# Patient Record
Sex: Female | Born: 1965 | Race: White | Hispanic: No | Marital: Married | Smoking: Current every day smoker
Health system: Southern US, Community
[De-identification: ages and names within clinical notes are randomized; demographics above are authoritative.]

## PROBLEM LIST (undated history)

## (undated) DIAGNOSIS — G5600 Carpal tunnel syndrome, unspecified upper limb: Secondary | ICD-10-CM

## (undated) DIAGNOSIS — E669 Obesity, unspecified: Secondary | ICD-10-CM

## (undated) DIAGNOSIS — K219 Gastro-esophageal reflux disease without esophagitis: Secondary | ICD-10-CM

## (undated) DIAGNOSIS — F329 Major depressive disorder, single episode, unspecified: Secondary | ICD-10-CM

## (undated) DIAGNOSIS — G4730 Sleep apnea, unspecified: Secondary | ICD-10-CM

## (undated) DIAGNOSIS — M25569 Pain in unspecified knee: Secondary | ICD-10-CM

## (undated) DIAGNOSIS — M5030 Other cervical disc degeneration, unspecified cervical region: Secondary | ICD-10-CM

## (undated) DIAGNOSIS — R7303 Prediabetes: Secondary | ICD-10-CM

## (undated) DIAGNOSIS — Z87410 Personal history of cervical dysplasia: Secondary | ICD-10-CM

## (undated) DIAGNOSIS — R079 Chest pain, unspecified: Secondary | ICD-10-CM

## (undated) DIAGNOSIS — R61 Generalized hyperhidrosis: Secondary | ICD-10-CM

## (undated) DIAGNOSIS — Z720 Tobacco use: Secondary | ICD-10-CM

## (undated) DIAGNOSIS — E039 Hypothyroidism, unspecified: Secondary | ICD-10-CM

## (undated) DIAGNOSIS — E119 Type 2 diabetes mellitus without complications: Secondary | ICD-10-CM

## (undated) DIAGNOSIS — K5790 Diverticulosis of intestine, part unspecified, without perforation or abscess without bleeding: Secondary | ICD-10-CM

## (undated) DIAGNOSIS — R5383 Other fatigue: Secondary | ICD-10-CM

## (undated) DIAGNOSIS — N63 Unspecified lump in unspecified breast: Secondary | ICD-10-CM

## (undated) HISTORY — DX: Other fatigue: R53.83

## (undated) HISTORY — DX: Obesity, unspecified: E66.9

## (undated) HISTORY — DX: Prediabetes: R73.03

## (undated) HISTORY — DX: Major depressive disorder, single episode, unspecified: F32.9

## (undated) HISTORY — PX: CHOLECYSTECTOMY: SHX55

## (undated) HISTORY — DX: Unspecified lump in unspecified breast: N63.0

## (undated) HISTORY — DX: Type 2 diabetes mellitus without complications: E11.9

## (undated) HISTORY — DX: Pain in unspecified knee: M25.569

## (undated) HISTORY — DX: Tobacco use: Z72.0

## (undated) HISTORY — DX: Chest pain, unspecified: R07.9

## (undated) HISTORY — PX: ABDOMINAL HYSTERECTOMY: SHX81

## (undated) HISTORY — DX: Generalized hyperhidrosis: R61

## (undated) HISTORY — PX: HEMORROIDECTOMY: SUR656

## (undated) HISTORY — PX: COLONOSCOPY: SHX174

## (undated) HISTORY — PX: ESOPHAGOGASTRODUODENOSCOPY: SHX1529

## (undated) HISTORY — DX: Personal history of cervical dysplasia: Z87.410

## (undated) HISTORY — DX: Other cervical disc degeneration, unspecified cervical region: M50.30

## (undated) HISTORY — PX: CERVICAL DISCECTOMY: SHX98

## (undated) HISTORY — PX: EXPLORATORY LAPAROTOMY: SUR591

## (undated) HISTORY — PX: TONSILLECTOMY: SUR1361

---

## 1999-06-16 ENCOUNTER — Emergency Department (HOSPITAL_COMMUNITY): Admission: EM | Admit: 1999-06-16 | Discharge: 1999-06-17 | Payer: Self-pay | Admitting: *Deleted

## 1999-06-17 ENCOUNTER — Emergency Department (HOSPITAL_COMMUNITY): Admission: EM | Admit: 1999-06-17 | Discharge: 1999-06-17 | Payer: Self-pay | Admitting: Emergency Medicine

## 1999-06-18 ENCOUNTER — Encounter: Payer: Self-pay | Admitting: Emergency Medicine

## 1999-07-10 ENCOUNTER — Ambulatory Visit (HOSPITAL_COMMUNITY): Admission: RE | Admit: 1999-07-10 | Discharge: 1999-07-10 | Payer: Self-pay | Admitting: Neurosurgery

## 1999-07-10 ENCOUNTER — Encounter: Payer: Self-pay | Admitting: Neurosurgery

## 1999-07-27 ENCOUNTER — Emergency Department (HOSPITAL_COMMUNITY): Admission: EM | Admit: 1999-07-27 | Discharge: 1999-07-27 | Payer: Self-pay | Admitting: *Deleted

## 2000-06-29 ENCOUNTER — Emergency Department (HOSPITAL_COMMUNITY): Admission: EM | Admit: 2000-06-29 | Discharge: 2000-06-29 | Payer: Self-pay | Admitting: Emergency Medicine

## 2000-06-29 ENCOUNTER — Encounter: Payer: Self-pay | Admitting: Emergency Medicine

## 2001-01-06 ENCOUNTER — Encounter: Payer: Self-pay | Admitting: Emergency Medicine

## 2001-01-06 ENCOUNTER — Emergency Department (HOSPITAL_COMMUNITY): Admission: EM | Admit: 2001-01-06 | Discharge: 2001-01-06 | Payer: Self-pay | Admitting: Emergency Medicine

## 2001-01-16 ENCOUNTER — Ambulatory Visit (HOSPITAL_COMMUNITY): Admission: RE | Admit: 2001-01-16 | Discharge: 2001-01-16 | Payer: Self-pay | Admitting: Family Medicine

## 2001-01-16 ENCOUNTER — Encounter: Payer: Self-pay | Admitting: Family Medicine

## 2001-03-15 ENCOUNTER — Ambulatory Visit (HOSPITAL_COMMUNITY): Admission: RE | Admit: 2001-03-15 | Discharge: 2001-03-15 | Payer: Self-pay | Admitting: *Deleted

## 2001-03-15 ENCOUNTER — Encounter (INDEPENDENT_AMBULATORY_CARE_PROVIDER_SITE_OTHER): Payer: Self-pay

## 2001-05-04 ENCOUNTER — Emergency Department (HOSPITAL_COMMUNITY): Admission: EM | Admit: 2001-05-04 | Discharge: 2001-05-05 | Payer: Self-pay

## 2002-01-30 ENCOUNTER — Encounter: Payer: Self-pay | Admitting: Family Medicine

## 2002-01-30 ENCOUNTER — Ambulatory Visit (HOSPITAL_COMMUNITY): Admission: RE | Admit: 2002-01-30 | Discharge: 2002-01-30 | Payer: Self-pay | Admitting: Family Medicine

## 2003-05-21 ENCOUNTER — Emergency Department (HOSPITAL_COMMUNITY): Admission: AD | Admit: 2003-05-21 | Discharge: 2003-05-21 | Payer: Self-pay | Admitting: Emergency Medicine

## 2003-05-21 ENCOUNTER — Encounter: Payer: Self-pay | Admitting: Emergency Medicine

## 2003-08-14 ENCOUNTER — Other Ambulatory Visit: Admission: RE | Admit: 2003-08-14 | Discharge: 2003-08-14 | Payer: Self-pay | Admitting: Obstetrics and Gynecology

## 2004-01-11 ENCOUNTER — Emergency Department (HOSPITAL_COMMUNITY): Admission: AD | Admit: 2004-01-11 | Discharge: 2004-01-11 | Payer: Self-pay | Admitting: Family Medicine

## 2004-02-24 ENCOUNTER — Emergency Department (HOSPITAL_COMMUNITY): Admission: AD | Admit: 2004-02-24 | Discharge: 2004-02-24 | Payer: Self-pay | Admitting: Family Medicine

## 2004-06-21 ENCOUNTER — Emergency Department (HOSPITAL_COMMUNITY): Admission: EM | Admit: 2004-06-21 | Discharge: 2004-06-21 | Payer: Self-pay | Admitting: Emergency Medicine

## 2004-06-22 ENCOUNTER — Emergency Department (HOSPITAL_COMMUNITY): Admission: EM | Admit: 2004-06-22 | Discharge: 2004-06-23 | Payer: Self-pay | Admitting: Emergency Medicine

## 2004-09-09 ENCOUNTER — Ambulatory Visit (HOSPITAL_COMMUNITY): Admission: RE | Admit: 2004-09-09 | Discharge: 2004-09-09 | Payer: Self-pay | Admitting: Cardiology

## 2004-09-24 ENCOUNTER — Encounter (INDEPENDENT_AMBULATORY_CARE_PROVIDER_SITE_OTHER): Payer: Self-pay | Admitting: *Deleted

## 2004-09-24 ENCOUNTER — Ambulatory Visit (HOSPITAL_COMMUNITY): Admission: RE | Admit: 2004-09-24 | Discharge: 2004-09-24 | Payer: Self-pay | Admitting: *Deleted

## 2005-01-20 ENCOUNTER — Other Ambulatory Visit: Admission: RE | Admit: 2005-01-20 | Discharge: 2005-01-20 | Payer: Self-pay | Admitting: Obstetrics and Gynecology

## 2005-05-24 ENCOUNTER — Observation Stay (HOSPITAL_COMMUNITY): Admission: RE | Admit: 2005-05-24 | Discharge: 2005-05-25 | Payer: Self-pay | Admitting: Obstetrics and Gynecology

## 2005-05-24 ENCOUNTER — Encounter (INDEPENDENT_AMBULATORY_CARE_PROVIDER_SITE_OTHER): Payer: Self-pay | Admitting: *Deleted

## 2005-06-17 ENCOUNTER — Ambulatory Visit (HOSPITAL_COMMUNITY): Admission: RE | Admit: 2005-06-17 | Discharge: 2005-06-17 | Payer: Self-pay | Admitting: Obstetrics and Gynecology

## 2006-07-25 ENCOUNTER — Emergency Department (HOSPITAL_COMMUNITY): Admission: EM | Admit: 2006-07-25 | Discharge: 2006-07-25 | Payer: Self-pay | Admitting: Emergency Medicine

## 2006-11-08 ENCOUNTER — Emergency Department (HOSPITAL_COMMUNITY): Admission: EM | Admit: 2006-11-08 | Discharge: 2006-11-08 | Payer: Self-pay | Admitting: Family Medicine

## 2007-04-04 ENCOUNTER — Emergency Department (HOSPITAL_COMMUNITY): Admission: EM | Admit: 2007-04-04 | Discharge: 2007-04-04 | Payer: Self-pay | Admitting: Emergency Medicine

## 2007-05-01 ENCOUNTER — Emergency Department (HOSPITAL_COMMUNITY): Admission: EM | Admit: 2007-05-01 | Discharge: 2007-05-01 | Payer: Self-pay | Admitting: Emergency Medicine

## 2007-11-25 ENCOUNTER — Emergency Department (HOSPITAL_COMMUNITY): Admission: EM | Admit: 2007-11-25 | Discharge: 2007-11-25 | Payer: Self-pay | Admitting: Emergency Medicine

## 2008-11-17 ENCOUNTER — Emergency Department (HOSPITAL_COMMUNITY): Admission: EM | Admit: 2008-11-17 | Discharge: 2008-11-18 | Payer: Self-pay | Admitting: Emergency Medicine

## 2008-12-11 ENCOUNTER — Emergency Department (HOSPITAL_COMMUNITY): Admission: EM | Admit: 2008-12-11 | Discharge: 2008-12-11 | Payer: Self-pay | Admitting: Family Medicine

## 2009-03-13 ENCOUNTER — Emergency Department (HOSPITAL_COMMUNITY): Admission: EM | Admit: 2009-03-13 | Discharge: 2009-03-13 | Payer: Self-pay | Admitting: Emergency Medicine

## 2009-05-11 ENCOUNTER — Emergency Department: Payer: Self-pay | Admitting: Internal Medicine

## 2010-11-19 ENCOUNTER — Emergency Department (HOSPITAL_COMMUNITY)
Admission: EM | Admit: 2010-11-19 | Discharge: 2010-11-20 | Payer: Self-pay | Source: Home / Self Care | Admitting: Emergency Medicine

## 2010-12-05 ENCOUNTER — Encounter: Payer: Self-pay | Admitting: Orthopedic Surgery

## 2011-01-06 ENCOUNTER — Emergency Department: Payer: Self-pay | Admitting: Emergency Medicine

## 2011-01-11 ENCOUNTER — Emergency Department: Payer: Self-pay | Admitting: Emergency Medicine

## 2011-01-11 ENCOUNTER — Emergency Department (HOSPITAL_COMMUNITY)
Admission: EM | Admit: 2011-01-11 | Discharge: 2011-01-11 | Discharge status: Against Medical Advice | Payer: Self-pay | Attending: Emergency Medicine | Admitting: Emergency Medicine

## 2011-01-11 DIAGNOSIS — R509 Fever, unspecified: Secondary | ICD-10-CM | POA: Insufficient documentation

## 2011-01-11 DIAGNOSIS — R1012 Left upper quadrant pain: Secondary | ICD-10-CM | POA: Insufficient documentation

## 2011-01-15 ENCOUNTER — Emergency Department (HOSPITAL_COMMUNITY)
Admission: EM | Admit: 2011-01-15 | Discharge: 2011-01-15 | Disposition: A | Discharge status: Home / Self Care | Payer: Self-pay | Attending: Emergency Medicine | Admitting: Emergency Medicine

## 2011-01-15 ENCOUNTER — Emergency Department (HOSPITAL_COMMUNITY): Payer: Self-pay

## 2011-01-15 DIAGNOSIS — R0602 Shortness of breath: Secondary | ICD-10-CM | POA: Insufficient documentation

## 2011-01-15 DIAGNOSIS — R209 Unspecified disturbances of skin sensation: Secondary | ICD-10-CM | POA: Insufficient documentation

## 2011-01-15 DIAGNOSIS — R109 Unspecified abdominal pain: Secondary | ICD-10-CM | POA: Insufficient documentation

## 2011-01-15 DIAGNOSIS — R071 Chest pain on breathing: Secondary | ICD-10-CM | POA: Insufficient documentation

## 2011-01-15 LAB — CBC
HCT: 43.1 % (ref 36.0–46.0)
Hemoglobin: 14.4 g/dL (ref 12.0–15.0)
MCHC: 33.4 g/dL (ref 30.0–36.0)
RBC: 4.86 MIL/uL (ref 3.87–5.11)

## 2011-01-15 LAB — DIFFERENTIAL
Basophils Absolute: 0.1 10*3/uL (ref 0.0–0.1)
Basophils Relative: 1 % (ref 0–1)
Lymphocytes Relative: 46 % (ref 12–46)
Monocytes Absolute: 0.7 10*3/uL (ref 0.1–1.0)
Neutro Abs: 4.7 10*3/uL (ref 1.7–7.7)
Neutrophils Relative %: 44 % (ref 43–77)

## 2011-01-15 LAB — URINALYSIS, ROUTINE W REFLEX MICROSCOPIC
Bilirubin Urine: NEGATIVE
Glucose, UA: NEGATIVE mg/dL
Ketones, ur: NEGATIVE mg/dL
Protein, ur: NEGATIVE mg/dL
Urobilinogen, UA: 0.2 mg/dL (ref 0.0–1.0)

## 2011-01-15 LAB — POCT CARDIAC MARKERS
CKMB, poc: <1 ng/mL — ABNORMAL LOW (ref 1.0–8.0)
CKMB, poc: <1 ng/mL — ABNORMAL LOW (ref 1.0–8.0)
Myoglobin, poc: 46.4 ng/mL (ref 12–200)
Myoglobin, poc: 41.6 ng/mL (ref 12–200)
Troponin i, poc: <0.05 ng/mL (ref 0.00–0.09)

## 2011-01-15 LAB — BASIC METABOLIC PANEL
Calcium: 9 mg/dL (ref 8.4–10.5)
Creatinine, Ser: 0.77 mg/dL (ref 0.4–1.2)
GFR calc Af Amer: >60 mL/min (ref >60)
GFR calc non Af Amer: >60 mL/min (ref >60)
Sodium: 138 mEq/L (ref 135–145)

## 2011-01-15 LAB — URINE MICROSCOPIC-ADD ON

## 2011-01-15 LAB — D-DIMER, QUANTITATIVE: D-Dimer, Quant: 0.23 ug/mL-FEU (ref 0.00–0.48)

## 2011-01-16 LAB — URINE CULTURE: Culture  Setup Time: 201203031109

## 2011-04-01 NOTE — H&P (Signed)
Melanie Oneal, Melanie Oneal                 ACCOUNT NO.:  [id number]   MEDICAL RECORD NO.:  [id number]          PATIENT TYPE:  AMB   LOCATION:  SDC                           FACILITY:  [facility]   PHYSICIAN:  Guy Sandifer. Henderson Cloud, M.D. DATE OF BIRTH:  06/10/1966   DATE OF ADMISSION:  05/24/2005  DATE OF DISCHARGE:                                HISTORY & PHYSICAL   CHIEF COMPLAINT:  Heavy painful menses.   HISTORY OF PRESENT ILLNESS:  This patient is a 45 year old, single, white  female, G2, P1, having menses every 4 to 8 weeks.  Menses are becoming  progressively heavier.  She has pain that occasionally takes her off of her  feet with her menses.  Ultrasound, on February 17, 2005, revealed a uterus  measuring 7.8 x 3.7-cm, at least two leiomyomata measuring 20-mm and 7-mm  are noted.  After a careful discussion of the options, she is being admitted  for laparoscopically assisted vaginal hysterectomy and bilateral salpingo-  oophorectomy.  The risks of surgery have been discussed preoperatively.  Risks and benefits of ovarian preservation versus BSO have also been  reviewed.   PAST MEDICAL HISTORY:  History of cervical dysplasia with cold knife  conization in 1988.   PAST SURGICAL HISTORY:  1.  Hysteroscopy D&C, 1995.  2.  Cervical disk fusion, 1996.  3.  T&A as a child.   OBSTETRIC HISTORY:  1.  Vaginal delivery x 1.  2.  Miscarriage x 1.   FAMILY HISTORY:  Diabetes - paternal grandmother, brother, and sister.  Uterine cancer in mother.  Hypoglycemia in mother.   SOCIAL HISTORY:  The patient smokes one pack of cigarettes a day.  Denies  alcohol or drug abuse.   REVIEW OF SYSTEMS:  NEURO:  Denies headache.  CARDIO:  Denies chest pain.  PULMONARY:  Denies shortness of breath.  GI:  Denies recent changes in bowel  habits.   PHYSICAL EXAMINATION:  VITAL SIGNS:  Height 5 feet 7 inches, weight 200  pounds, blood pressure 110/76.  HEENT:  Without thyromegaly.  LUNGS:  Clear to auscultation.  HEART:  Regular rate and rhythm.  BACK:  Without CVA tenderness.  BREASTS:  Without masses, retraction, discharge.  ABDOMEN:  Soft, nontender without masses.  PELVIC:  Vulva, vagina, cervix without lesion.  Uterus normal size, mobile,  mildly tender.  Adnexa nontender without masses.  EXTREMITIES:  Grossly within normal limits.  NEUROLOGIC:  Grossly within normal limits.   MEDICATIONS:  None.   ALLERGIES:  1.  CEFTIN leading to anaphylactic shock.  2.  TEGRETOL.  3.  BUSPAR.  4.  WELLBUTRIN leading to hives.       JET/MEDQ  D:  05/20/2005  T:  05/20/2005  Job:  [id number]

## 2011-04-01 NOTE — Op Note (Signed)
[name], Melanie Oneal                 ACCOUNT NO.:  [id number]   MEDICAL RECORD NO.:  [id number]          PATIENT TYPE:  AMB   LOCATION:  DAY                          [facility]ACILITY:  [facility]   PHYSICIAN:  Vikki Ports, MDDATE OF BIRTH:  Mar 19, 1966   DATE OF PROCEDURE:  09/24/2004  DATE OF DISCHARGE:                                 OPERATIVE REPORT   PREOPERATIVE DIAGNOSIS:  Grade 3 internal hemorrhoids.   POSTOPERATIVE DIAGNOSIS:  Grade 3 internal hemorrhoids.   PROCEDURE:  PPH rectopexy.   SURGEON:  Danna Hefty, MD   ANESTHESIA:  General.   DESCRIPTION OF PROCEDURE:  The patient was taken to the operating room,  placed in a supine position.  After adequate general anesthesia was induced  using endotracheal tube, patient was placed in the prone jack-knife  position.  Rectal and perianal prep were undertaken.  Anal dilatation was  accomplished to 3 fingerbreadths, and hemorrhoidal bundles were injected  with Marcaine and Wydase.  The internal and external sphincter muscles were  injected with 30 mL of 0.25% Marcaine.   I then placed a 2-0 pursestring suture 5 cm proximal to the dentate line,  placed a stapler in and proximal to the pursestring, tied down the  pursestring, fired the stapler, and removed it.  On inspection of the staple  line, there was one area with a little bit of oozing, and I oversewed this  with 3-0 chromic suture and placed Gelfoam.  The patient tolerated the  procedure well and went to PACU in good condition.     Gaylyn Rong   KRH/MEDQ  D:  09/24/2004  T:  09/24/2004  Job:  [id number]

## 2011-04-01 NOTE — Discharge Summary (Signed)
NAME:  Melanie Oneal, Melanie Oneal ACCOUNT NO.:  [id number]   MEDICAL RECORD NO.:  [id number]        PATIENT TYPE:  OBV   LOCATION:  9318                        FACILITY:  [facility]   PHYSICIAN:  Guy Sandifer. Tomblin, M.D.DATE OF BIRTH:  Oct 29, 1966   DATE OF ADMISSION:  05/24/2005  DATE OF DISCHARGE:  05/25/2005                                 DISCHARGE SUMMARY   ADMISSION DIAGNOSES:  1.  Uterine leiomyomata.  2.  Menorrhagia.   DISCHARGE DIAGNOSES:  1.  Uterine leiomyomata.  2.  Menorrhagia.   OPERATION/PROCEDURE:  On May 24, 2005, laparoscopic-assisted vaginal  hysteroscopy and bilateral salpingo-oophorectomy and cystoscopy.   REASON FOR ADMISSION:  The patient is a 45 year old single white female, G2,  P1, with increasingly symptomatic uterine leiomyomata.  Details are dictated  in the history and physical.   HOSPITAL COURSE:  The patient was taken to the operating room and undergoes  to above procedure.  On the evening of surgery, she has good pain control  and vital signs are stable.  On the day of discharge, vital signs were  stable and she is again afebrile.  She is tolerating regular diet,  ambulating and has good pain relief.  White count is 18.4, hemoglobin 12.5.  Pathology is pending.   CONDITION ON DISCHARGE:  Good.   DIET:  Regular as tolerated.   ACTIVITY:  No lifting.  No operation of automobiles, no vaginal entry.   FOLLOW UP:  She is to call the office for problems including, but not  limited to temperature of 101, heavy vaginal bleeding and persistent nausea  and vomiting or increasing pain.  Followup is in the office in two weeks.   DISCHARGE MEDICATIONS:  1.  The patient complains of some increasing anxiety and depressive-type      symptoms.  No suicidal or homicidal ideation.  No substance or domestic      abuse.  Paxil 20 mg #30 one p.o. daily. Six refills are given.      Potential side effects are reviewed.  2.  Percocet 5/325 mg #30 one to  two p.o. q.6h. p.r.n. no refills also      given.  3.  Multivitamin daily.  4.  Stool softener as needed.      Guy Sandifer Henderson Cloud, M.D.  Electronically Signed     JET/MEDQ  D:  05/25/2005  T:  05/25/2005  Job:  [id number]

## 2011-04-01 NOTE — Op Note (Signed)
[facility]  Patient:    Melanie Oneal, Melanie Oneal                        MRN: [id number] Proc. Date: 03/15/01 Adm. Date:  [id number] Attending:  Vikki Ports.                           Operative Report  PREOPERATIVE DIAGNOSIS:  Left neck lymphadenopathy.  POSTOPERATIVE DIAGNOSIS:  Left neck lymphadenopathy.  PROCEDURE:  Left posterior neck lymph node excisional biopsy.  SURGEON:  Danna Hefty, M.D.  ANESTHESIA:  Local MAC.  DESCRIPTION OF PROCEDURE:  The patient was taken to the operating room and placed in the supine position and after adequate anesthesia induced using MAC technique, the left posterior neck was prepped and draped in normal sterile fashion. Using 1% lidocaine with biprep, the skin and subcutaneous tissue overlying the neck and the posterior triangle was anesthetized. A transverse incision was made in the skin creased. I dissected down through the platysma onto the pulp of the lymph node. This was dissected free of surrounding structures using blunt and sharp dissection. It was delivered up and through the wound and excised in its entirety. It was sent for pathologic evaluation. Adequate hemostasis was ensured and the wound was closed with interrupted subcuticular 4-0 monocryls. Steri-Strips and sterile dressings were applied. The patient tolerated the procedure well and went to PACU in good condition. DD:  03/15/01 TD:  03/15/01 Job: [id number]

## 2011-04-01 NOTE — Op Note (Signed)
NAMETALEIGH, GERO                 ACCOUNT NO.:  [id number]   MEDICAL RECORD NO.:  [id number]          PATIENT TYPE:  AMB   LOCATION:  SDC                           FACILITY:  [facility]   PHYSICIAN:  Guy Sandifer. Henderson Cloud, M.D. DATE OF BIRTH:  07/15/1966   DATE OF PROCEDURE:  05/24/2005  DATE OF DISCHARGE:                                 OPERATIVE REPORT   PREOPERATIVE DIAGNOSES:  1.  Uterine leiomyomata.  2.  Menorrhagia.   POSTOPERATIVE DIAGNOSES:  1.  Uterine leiomyomata.  2.  Menorrhagia.   PROCEDURE:  1.  Laparoscopically-assisted vaginal hysterectomy with bilateral salpingo-      oophorectomy.  2. Cystoscopy.   SURGEON:  Guy Sandifer. Henderson Cloud, M.D.   ASSISTANT:  Juluis Mire, M.D.   ESTIMATED BLOOD LOSS:  200 cc.   SPECIMENS:  Uterus with bilateral tubes and ovaries.   INDICATION AND CONSENT:  The patient is a 45 year old single white female,  G2, P1, with known uterine leiomyomata having increasingly heavy menses.  Laparoscopically-assisted vaginal hysterectomy with bilateral salpingo-  oophorectomy has been discussed.  Potential risks and complications are  discussed preoperatively including but not limited to infection; bowel,  bladder, ureteral damage; bleeding requiring transfusion of blood products  with possible transfusion reaction, HIV, and hepatitis acquisition; DVT, PE,  and pneumonia; fistula formation; laparotomy.  Issues of menopause have also  been discussed.  All questions were answered and consent is signed on the  chart.   FINDINGS:  Upper abdomen is grossly normal.  Uterus is about 6 weeks' size  with some intramural leiomyoma.  Tubes and ovaries and anterior and  posterior cul-de-sacs are normal.   PROCEDURE:  The patient is taken to the operating room where she is  identified, placed in the dorsal supine position, and general anesthesia is  induced via endotracheal intubation.  She is then placed in the dorsal  lithotomy position where she is prepped  abdominally and vaginally.  Bladder  is straight catheterized.  Hulka tenaculum is placed, the uterus is  manipulated, and she is draped in a sterile fashion.  A small infraumbilical  incision is made and a disposable Veress needle is placed.  Syringe and drop  test are normal.  Two liters of gas are insufflated under low pressure with  good tympany in the right upper quadrant.  Veress needle is removed.  Using  towel clips on either side of the umbilicus to assist in elevation of the  abdominal wall, a disposable bladeless trocar sleeve is placed under the  direct visualization technique through the trocar sleeve.  Placement is  verified when the trocar is removed and no damage to surrounding structures  is noted.  A small suprapubic incision is made and a 5-mm disposable trocar  sleeve is placed under direct visualization without difficulty.  The above  findings are noted.  The right infundibulopelvic ligament is taken down with  the gyrus bipolar cutting instrument.  Progressive bites are taken across  the round ligament down to the vesicouterine peritoneum.  A similar  procedure is carried out on the left.  Good hemostasis is maintained.  The  vesicouterine peritoneum is incised in the midline, hydrodissected, and  taken down cephalad laterally with scissors.  The suprapubic trocar sleeve  is removed.  Pneumoperitoneum is reduced.  Attention is turned to the  vagina.   The posterior cul-de-sac is entered sharply without difficulty.  The cervix  is circumscribed with unipolar cautery.  The mucosa is advanced sharply and  bluntly.  Anterior cul-de-sac is entered without difficulty.  Then using the  gyrus bipolar cautery instrument, progressive bites are taken down the  uterosacral ligaments, bladder pillars, cardinal ligaments, and uterine  vessels bilaterally.  Fundus with tubes and ovaries is delivered  posteriorly.  Proximal ligaments are taken down.  The right pedicle is also   ligated with a free tie of 0 Monocryl.  The uterosacral ligaments are then  plicated in the vagina bilaterally.  All sutures are 0 Monocryl unless  otherwise designated.  Uterosacral ligaments are then plicated in the  midline with a third suture.  Cuff was closed with figure-of-eights.  Foley  catheter was then placed in the bladder and only a small amount of urine is  noted.  Therefore, the Foley catheter is removed and cystoscopy is carried  out with the 25-degree cystoscope.  No foreign bodies are noted.  There is  no evidence of perforation.  A good puff of urine is noted bilaterally from  the ureteral orifice.  Cystoscope is removed and replaced with a Foley  catheter.  Attention is then turned to the abdomen.   Pneumoperitoneum is re-introduced.  Suprapubic trocar sleeve is replaced  under direct visualization and copious irrigation is carried out.  Minor  oozing at the peritoneal edges is controlled with bipolar cautery.  Excess  fluid is removed.  Inspection and reducing of pneumoperitoneum reveals  continued hemostasis.  Suprapubic trocar sleeve is removed.  Pneumoperitoneum is completely reduced and the umbilical trocar sleeve is  removed as well.  The skin of the umbilicus is closed with simple sutures of  2-0 Vicryl.  Both incisions are injected with 0.5% plain Marcaine and  Dermabond is used to close the skin at all sites.  All counts are correct.  The patient is awakened and taken to the recovery room in stable condition.       JET/MEDQ  D:  05/24/2005  T:  05/24/2005  Job:  [id number]

## 2011-04-27 ENCOUNTER — Emergency Department (HOSPITAL_COMMUNITY): Payer: Self-pay

## 2011-04-27 ENCOUNTER — Emergency Department (HOSPITAL_COMMUNITY)
Admission: EM | Admit: 2011-04-27 | Discharge: 2011-04-27 | Disposition: A | Discharge status: Home / Self Care | Payer: Self-pay | Attending: Emergency Medicine | Admitting: Emergency Medicine

## 2011-04-27 DIAGNOSIS — W010XXA Fall on same level from slipping, tripping and stumbling without subsequent striking against object, initial encounter: Secondary | ICD-10-CM | POA: Insufficient documentation

## 2011-04-27 DIAGNOSIS — S8990XA Unspecified injury of unspecified lower leg, initial encounter: Secondary | ICD-10-CM | POA: Insufficient documentation

## 2011-04-27 DIAGNOSIS — S9030XA Contusion of unspecified foot, initial encounter: Secondary | ICD-10-CM | POA: Insufficient documentation

## 2011-04-27 DIAGNOSIS — S99919A Unspecified injury of unspecified ankle, initial encounter: Secondary | ICD-10-CM | POA: Insufficient documentation

## 2011-04-27 DIAGNOSIS — M79609 Pain in unspecified limb: Secondary | ICD-10-CM | POA: Insufficient documentation

## 2011-04-27 DIAGNOSIS — Y92009 Unspecified place in unspecified non-institutional (private) residence as the place of occurrence of the external cause: Secondary | ICD-10-CM | POA: Insufficient documentation

## 2011-04-27 DIAGNOSIS — M7989 Other specified soft tissue disorders: Secondary | ICD-10-CM | POA: Insufficient documentation

## 2011-06-05 ENCOUNTER — Emergency Department (HOSPITAL_COMMUNITY)
Admission: EM | Admit: 2011-06-05 | Discharge: 2011-06-05 | Disposition: A | Discharge status: Home / Self Care | Payer: Self-pay | Attending: Emergency Medicine | Admitting: Emergency Medicine

## 2011-06-05 DIAGNOSIS — X58XXXA Exposure to other specified factors, initial encounter: Secondary | ICD-10-CM | POA: Insufficient documentation

## 2011-06-05 DIAGNOSIS — M545 Low back pain, unspecified: Secondary | ICD-10-CM | POA: Insufficient documentation

## 2011-06-05 DIAGNOSIS — IMO0002 Reserved for concepts with insufficient information to code with codable children: Secondary | ICD-10-CM | POA: Insufficient documentation

## 2011-08-31 LAB — DIFFERENTIAL
Basophils Absolute: 0.1
Lymphocytes Relative: 42
Neutro Abs: 5.8

## 2011-08-31 LAB — CBC
Platelets: 321
RDW: 13.7
WBC: 12.3 — ABNORMAL HIGH

## 2011-08-31 LAB — URINE MICROSCOPIC-ADD ON

## 2011-08-31 LAB — URINALYSIS, ROUTINE W REFLEX MICROSCOPIC
Glucose, UA: NEGATIVE
Ketones, ur: NEGATIVE
Leukocytes, UA: NEGATIVE
pH: 6

## 2011-08-31 LAB — HEPATIC FUNCTION PANEL
ALT: 25
AST: 22
Albumin: 3.7
Total Protein: 7.4

## 2011-12-15 ENCOUNTER — Encounter (HOSPITAL_COMMUNITY): Payer: Self-pay | Admitting: Emergency Medicine

## 2011-12-15 ENCOUNTER — Emergency Department (HOSPITAL_COMMUNITY): Payer: Self-pay

## 2011-12-15 ENCOUNTER — Emergency Department (HOSPITAL_COMMUNITY)
Admission: EM | Admit: 2011-12-15 | Discharge: 2011-12-15 | Disposition: A | Discharge status: Home / Self Care | Payer: Self-pay | Attending: Emergency Medicine | Admitting: Emergency Medicine

## 2011-12-15 ENCOUNTER — Other Ambulatory Visit: Payer: Self-pay

## 2011-12-15 DIAGNOSIS — R0602 Shortness of breath: Secondary | ICD-10-CM | POA: Insufficient documentation

## 2011-12-15 DIAGNOSIS — R110 Nausea: Secondary | ICD-10-CM | POA: Insufficient documentation

## 2011-12-15 DIAGNOSIS — R079 Chest pain, unspecified: Secondary | ICD-10-CM | POA: Insufficient documentation

## 2011-12-15 DIAGNOSIS — R0789 Other chest pain: Secondary | ICD-10-CM | POA: Insufficient documentation

## 2011-12-15 DIAGNOSIS — F17200 Nicotine dependence, unspecified, uncomplicated: Secondary | ICD-10-CM | POA: Insufficient documentation

## 2011-12-15 DIAGNOSIS — R209 Unspecified disturbances of skin sensation: Secondary | ICD-10-CM | POA: Insufficient documentation

## 2011-12-15 LAB — CBC
Hemoglobin: 14.8 g/dL (ref 12.0–15.0)
MCH: 30.3 pg (ref 26.0–34.0)
MCHC: 34 g/dL (ref 30.0–36.0)

## 2011-12-15 LAB — DIFFERENTIAL
Basophils Relative: 1 % (ref 0–1)
Eosinophils Absolute: 0.4 10*3/uL (ref 0.0–0.7)
Eosinophils Relative: 3 % (ref 0–5)
Monocytes Absolute: 1.3 10*3/uL — ABNORMAL HIGH (ref 0.1–1.0)
Monocytes Relative: 9 % (ref 3–12)
Neutrophils Relative %: 50 % (ref 43–77)

## 2011-12-15 LAB — BASIC METABOLIC PANEL
BUN: 11 mg/dL (ref 6–23)
Calcium: 9.9 mg/dL (ref 8.4–10.5)
Creatinine, Ser: 0.74 mg/dL (ref 0.50–1.10)
GFR calc Af Amer: >90 mL/min (ref >90)
GFR calc non Af Amer: >90 mL/min (ref >90)
Potassium: 3.7 mEq/L (ref 3.5–5.1)

## 2011-12-15 MED ORDER — ASPIRIN 325 MG PO TABS
ORAL_TABLET | ORAL | Status: AC
  Administered 2011-12-15: 325 mg
  Filled 2011-12-15: qty 1

## 2011-12-15 MED ORDER — NITROGLYCERIN 0.4 MG SL SUBL
0.4000 mg | SUBLINGUAL_TABLET | SUBLINGUAL | Status: DC | PRN
  Administered 2011-12-15 (×3): 0.4 mg via SUBLINGUAL
  Filled 2011-12-15: qty 25

## 2011-12-15 MED ORDER — ALBUTEROL SULFATE (5 MG/ML) 0.5% IN NEBU
5.0000 mg | INHALATION_SOLUTION | RESPIRATORY_TRACT | Status: DC
  Administered 2011-12-15: 5 mg via RESPIRATORY_TRACT
  Filled 2011-12-15: qty 1

## 2011-12-15 MED ORDER — METHYLPREDNISOLONE SODIUM SUCC 125 MG IJ SOLR
125.0000 mg | Freq: Once | INTRAMUSCULAR | Status: AC
  Administered 2011-12-15: 125 mg via INTRAVENOUS
  Filled 2011-12-15: qty 2

## 2011-12-15 MED ORDER — PREDNISONE 10 MG PO TABS: 20.0000 mg | ORAL_TABLET | Freq: Every day | ORAL | Status: DC

## 2011-12-15 MED ORDER — IPRATROPIUM BROMIDE 0.02 % IN SOLN
0.5000 mg | Freq: Once | RESPIRATORY_TRACT | Status: AC
  Administered 2011-12-15: 0.5 mg via RESPIRATORY_TRACT
  Filled 2011-12-15: qty 2.5

## 2011-12-15 MED ORDER — ALBUTEROL SULFATE HFA 108 (90 BASE) MCG/ACT IN AERS
2.0000 | INHALATION_SPRAY | RESPIRATORY_TRACT | Status: DC
  Filled 2011-12-15: qty 6.7

## 2011-12-15 MED ORDER — ASPIRIN 81 MG PO CHEW: 324.0000 mg | CHEWABLE_TABLET | Freq: Once | ORAL | Status: DC

## 2011-12-15 MED ORDER — ALBUTEROL SULFATE HFA 108 (90 BASE) MCG/ACT IN AERS: 2.0000 | INHALATION_SPRAY | RESPIRATORY_TRACT | Status: DC

## 2011-12-15 NOTE — ED Provider Notes (Signed)
History     CSN: [id number]  Arrival date & time 12/15/11  0018   First MD Initiated Contact with Patient 12/15/11 0021      Chief Complaint  Patient presents with  . Chest Pain    (Consider location/radiation/quality/duration/timing/severity/associated sxs/prior treatment) HPI Comments: Patient here with sudden and acute onset of left anterior chest pain with radation into her left shoulder and left upper back, this is associated with shortness of breath and the shortness of breath is worsened with lying flat, she reports nausea and flushing feeling - she also reports left cheek numbness which is easing.  Patient reports no prior history of any cardiac disease, reports is on no medications and has no history except for smoking.  She does report cough, but denies fever, chills.  Patient is a 46 y.o. female presenting with chest pain. The history is provided by the patient and a friend. No language interpreter was used.  Chest Pain The chest pain began 1 - 2 hours ago. Chest pain occurs constantly. The chest pain is improving. The pain is associated with breathing and exertion. At its most intense, the pain is at 7/10. The pain is currently at 3/10. The severity of the pain is moderate. The quality of the pain is described as heavy and pressure-like. The pain radiates to the upper back and left shoulder. Chest pain is worsened by certain positions. Primary symptoms include shortness of breath and nausea. Pertinent negatives for primary symptoms include no fever, no fatigue, no syncope, no cough, no wheezing, no palpitations, no abdominal pain, no vomiting, no dizziness and no altered mental status.  Associated symptoms include diaphoresis and numbness.  Pertinent negatives for associated symptoms include no claudication, no lower extremity edema, no near-syncope, no orthopnea, no paroxysmal nocturnal dyspnea and no weakness. She tried nothing for the symptoms. Risk factors include smoking/tobacco  exposure and obesity.  Pertinent negatives for past medical history include no diabetes, no hyperlipidemia and no hypertension.     History reviewed. No pertinent past medical history.  Past Surgical History  Procedure Date  . Abdominal hysterectomy     No family history on file.  History  Substance Use Topics  . Smoking status: Current Everyday Smoker -- 1.0 packs/day    Types: Cigarettes  . Smokeless tobacco: Not on file  . Alcohol Use: No    OB History    Grav Para Term Preterm Abortions TAB SAB Ect Mult Living                  Review of Systems  Constitutional: Positive for diaphoresis. Negative for fever and fatigue.  Respiratory: Positive for shortness of breath. Negative for cough and wheezing.   Cardiovascular: Positive for chest pain. Negative for palpitations, orthopnea, claudication, syncope and near-syncope.  Gastrointestinal: Positive for nausea. Negative for vomiting and abdominal pain.  Neurological: Positive for numbness. Negative for dizziness and weakness.  Psychiatric/Behavioral: Negative for altered mental status.  All other systems reviewed and are negative.    Allergies  Buspar; Ceftin; Penicillins; and Wellbutrin  Home Medications  No current outpatient prescriptions on file.  BP 144/83  Pulse 99  Temp 98 F (36.7 C)  Resp 11  Wt 230 lb (104.327 kg)  SpO2 98%  Physical Exam  Nursing note and vitals reviewed. Constitutional: She is oriented to person, place, and time. She appears well-developed and well-nourished. No distress.  HENT:  Head: Normocephalic and atraumatic.  Right Ear: External ear normal.  Left Ear: External ear  normal.  Nose: Nose normal.  Mouth/Throat: Oropharynx is clear and moist. No oropharyngeal exudate.  Eyes: Conjunctivae are normal. Pupils are equal, round, and reactive to light. No scleral icterus.  Neck: Normal range of motion. Neck supple. No JVD present.  Cardiovascular: Normal rate, regular rhythm and  normal heart sounds.  Exam reveals no gallop and no friction rub.   No murmur heard. Pulmonary/Chest: Effort normal and breath sounds normal. No respiratory distress. She has no wheezes. She has no rales. She exhibits no tenderness.  Abdominal: Soft. Bowel sounds are normal. She exhibits no distension. There is no tenderness.  Musculoskeletal: Normal range of motion. She exhibits no edema and no tenderness.  Lymphadenopathy:    She has no cervical adenopathy.  Neurological: She is alert and oriented to person, place, and time. No cranial nerve deficit.  Skin: Skin is warm and dry. No rash noted. No erythema. No pallor.  Psychiatric: She has a normal mood and affect. Her behavior is normal. Judgment and thought content normal.    ED Course  Procedures (including critical care time)  Labs Reviewed - No data to display No results found.   Date: 12/15/2011  Rate: 94  Rhythm: normal sinus rhythm  QRS Axis: normal  Intervals: normal  ST/T Wave abnormalities: normal  Conduction Disutrbances:Borderline AV conduction delay  Narrative Interpretation: Reviewed by Dr. Patria Mane  Old EKG Reviewed: unchanged Results for orders placed during the hospital encounter of 12/15/11  CBC      Component Value Range   WBC 14.3 (*) 4.0 - 10.5 (K/uL)   RBC 4.89  3.87 - 5.11 (MIL/uL)   Hemoglobin 14.8  12.0 - 15.0 (g/dL)   HCT 45.4  09.8 - 11.9 (%)   MCV 89.0  78.0 - 100.0 (fL)   MCH 30.3  26.0 - 34.0 (pg)   MCHC 34.0  30.0 - 36.0 (g/dL)   RDW 14.7  82.9 - 56.2 (%)   Platelets 317  150 - 400 (K/uL)  DIFFERENTIAL      Component Value Range   Neutrophils Relative 50  43 - 77 (%)   Neutro Abs 7.1  1.7 - 7.7 (K/uL)   Lymphocytes Relative 37  12 - 46 (%)   Lymphs Abs 5.3 (*) 0.7 - 4.0 (K/uL)   Monocytes Relative 9  3 - 12 (%)   Monocytes Absolute 1.3 (*) 0.1 - 1.0 (K/uL)   Eosinophils Relative 3  0 - 5 (%)   Eosinophils Absolute 0.4  0.0 - 0.7 (K/uL)   Basophils Relative 1  0 - 1 (%)   Basophils  Absolute 0.1  0.0 - 0.1 (K/uL)  BASIC METABOLIC PANEL      Component Value Range   Sodium 136  135 - 145 (mEq/L)   Potassium 3.7  3.5 - 5.1 (mEq/L)   Chloride 100  96 - 112 (mEq/L)   CO2 27  19 - 32 (mEq/L)   Glucose, Bld 104 (*) 70 - 99 (mg/dL)   BUN 11  6 - 23 (mg/dL)   Creatinine, Ser 1.30  0.50 - 1.10 (mg/dL)   Calcium 9.9  8.4 - 86.5 (mg/dL)   GFR calc non Af Amer >90  >90 (mL/min)   GFR calc Af Amer >90  >90 (mL/min)  D-DIMER, QUANTITATIVE      Component Value Range   D-Dimer, Quant 0.26  0.00 - 0.48 (ug/mL-FEU)  POCT I-STAT TROPONIN I      Component Value Range   Troponin i, poc 0.01  0.00 -  0.08 (ng/mL)   Comment 3           POCT I-STAT TROPONIN I      Component Value Range   Troponin i, poc 0.00  0.00 - 0.08 (ng/mL)   Comment 3            Dg Chest 2 View  12/15/2011  *RADIOLOGY REPORT*  Clinical Data: Chest pain, high blood pressure  CHEST - 2 VIEW  Comparison: 01/15/2011  Findings: Upper-normal size of cardiac silhouette. Mediastinal contours and pulmonary vascularity normal. Lungs clear. No pleural effusion or pneumothorax. Bones appear demineralized.  IMPRESSION: No acute abnormalities.  Original Report Authenticated By: Lollie Marrow, M.D.      Atypical chest pain Dyspnea    MDM  Second set of troponin negative, patient complains mainly of respiratory type symptoms in this long time smoker, so I think this is the main etiology of the complaint, reports improvement after steroids and albuterol nebs, plan to discharge home      Scarlette Calico C. [place], [place] 12/15/11 [phone]

## 2011-12-15 NOTE — ED Notes (Signed)
Urine sent to the lab. No orders at this time.

## 2011-12-15 NOTE — ED Notes (Signed)
Pt alert, presents to ED, c/o chest pain, onset last PM, pt states sudden onset, non radiating, left chest wall pain, states pain not changed by respiration, skin pwd

## 2011-12-15 NOTE — ED Provider Notes (Signed)
Medical screening examination/treatment/procedure(s) were performed by non-physician practitioner and as supervising physician I was immediately available for consultation/collaboration.   Lyanne Co, MD 12/15/11 [phone]

## 2011-12-15 NOTE — ED Notes (Signed)
Pt states chest pain starting approx one hour ago.  Pt states nothing makes the pain better or worse.  Pt denies SOB.  Pt resting on stretcher.  Pt reports pain as 6/10 that is tight with intermittent sharpness.  Pt also reports pain to her neck that is sharp.  Respirations even and unlabored.

## 2011-12-15 NOTE — ED Notes (Signed)
Patient transported to X-ray 

## 2012-10-28 ENCOUNTER — Observation Stay (HOSPITAL_COMMUNITY)
Admission: EM | Admit: 2012-10-28 | Discharge: 2012-10-28 | Disposition: A | Discharge status: Home / Self Care | Payer: Self-pay | Attending: Emergency Medicine | Admitting: Emergency Medicine

## 2012-10-28 ENCOUNTER — Encounter (HOSPITAL_COMMUNITY): Payer: Self-pay | Admitting: *Deleted

## 2012-10-28 ENCOUNTER — Emergency Department (HOSPITAL_COMMUNITY): Payer: Self-pay

## 2012-10-28 DIAGNOSIS — Z720 Tobacco use: Secondary | ICD-10-CM | POA: Diagnosis present

## 2012-10-28 DIAGNOSIS — R0789 Other chest pain: Principal | ICD-10-CM | POA: Insufficient documentation

## 2012-10-28 DIAGNOSIS — E669 Obesity, unspecified: Secondary | ICD-10-CM

## 2012-10-28 DIAGNOSIS — R079 Chest pain, unspecified: Secondary | ICD-10-CM

## 2012-10-28 DIAGNOSIS — F17200 Nicotine dependence, unspecified, uncomplicated: Secondary | ICD-10-CM

## 2012-10-28 HISTORY — DX: Chest pain, unspecified: R07.9

## 2012-10-28 HISTORY — DX: Obesity, unspecified: E66.9

## 2012-10-28 HISTORY — DX: Tobacco use: Z72.0

## 2012-10-28 LAB — CBC WITH DIFFERENTIAL/PLATELET
Eosinophils Absolute: 0.5 10*3/uL (ref 0.0–0.7)
Eosinophils Relative: 4 % (ref 0–5)
HCT: 43 % (ref 36.0–46.0)
Hemoglobin: 14 g/dL (ref 12.0–15.0)
Lymphocytes Relative: 37 % (ref 12–46)
Lymphs Abs: 4.4 10*3/uL — ABNORMAL HIGH (ref 0.7–4.0)
MCH: 29.4 pg (ref 26.0–34.0)
MCV: 90.3 fL (ref 78.0–100.0)
Monocytes Relative: 9 % (ref 3–12)
RBC: 4.76 MIL/uL (ref 3.87–5.11)

## 2012-10-28 LAB — BASIC METABOLIC PANEL
CO2: 27 mEq/L (ref 19–32)
Creatinine, Ser: 0.66 mg/dL (ref 0.50–1.10)
GFR calc Af Amer: >90 mL/min (ref >90)
Glucose, Bld: 108 mg/dL — ABNORMAL HIGH (ref 70–99)

## 2012-10-28 LAB — TROPONIN I
Troponin I: <0.3 ng/mL (ref <0.30)
Troponin I: <0.3 ng/mL (ref <0.30)

## 2012-10-28 MED ORDER — ASPIRIN 81 MG PO CHEW
162.0000 mg | CHEWABLE_TABLET | Freq: Once | ORAL | Status: AC
  Administered 2012-10-28: 162 mg via ORAL
  Filled 2012-10-28: qty 2

## 2012-10-28 NOTE — Consult Note (Signed)
[facility] Medical Consultation  [name]:[id number] DOB: October 28, 1966 DOA: 10/28/2012 PCP: No primary provider on file.   Requesting physician: ED provider Date of consultation: 10/28/12 Reason for consultation: CP  Impression/Recommendations Principal Problem:  *Chest pain  Assessment and Plan  Atypical chest pain : I discussed her care with Dr. Effie Shy and he was very helpful. He will discharge patient from the ED and try to arrange outpatient cardiac stress test after her last troponin is negative. Pt understands and agrees with plan. She is advised to come to ED if she has any recurrent issues.   Tobacco use : she is counseled to quit but she is not interested in quitting at this time.   Obesity : contributes as a risk factor for CAD with smoking.   Leucocytosis, minimal and likely reactive.  .  Chief Complaint: CP HPI:  [name] came to ED this morning around 330 am. She was with her friends when she developed about 20 episodes of pressure like chest pain over the left side of her chest. They were non radiating and lasted about few seconds each. She was also having dizziness and SOB during these episodes. Some episodes continued while in ED also. She received 2 BASA and eventually felt better. Her Cardiac w/u has been negative. She is chest pain free for several hours now. She mentions that she had stress test when she turned 38. No other cardiac history at all. Mother had rheumatic heart disease and father with DM. She has no other complaints of h/o PE, pneumonia or CHF. Years ago, she used to have GERD. She continues to smoke cigarettes. She has no fever, chills, n/v/d/AP or any other symptoms. CP is not reproducible with activity or movementof chest or coughing. Has had intermittent pains in left breast recently. She would like to go home today if possible.     Review of Systems:  Positive as per HPI otherwise 14 ROS were negative.   No past medical history on  file. Past Surgical History  Procedure Date  . Abdominal hysterectomy   . Cholecystectomy   . Cervical discectomy   . Tonsillectomy    Social History:  reports that she has been smoking Cigarettes.  She has been smoking about 1 pack per day. She does not have any smokeless tobacco history on file. She reports that she does not drink alcohol. Her drug history not on file.  Allergies  Allergen Reactions  . Ceftin Anaphylaxis  . Penicillins Anaphylaxis  . Buspar (Buspirone Hcl) Hives  . Wellbutrin (Bupropion Hcl) Hives   No family history on file.  Prior to Admission medications   Medication Sig Start Date End Date Taking? Authorizing Provider  Clarithromycin (BIAXIN PO) Take 1 tablet by mouth 2 (two) times daily. 10/24/12 11/02/12 Yes Historical Provider, MD   Physical Exam: Blood pressure 138/67, pulse 88, temperature 98.8 F (37.1 C), temperature source Oral, resp. rate 16, height 5\' 7"  (1.702 m), weight 108.863 kg (240 lb), SpO2 96.00%. Filed Vitals:   10/28/12 0354 10/28/12 0712 10/28/12 0845 10/28/12 1122  BP: 130/68 129/75  138/67  Pulse: 94  88   Temp: 98.1 F (36.7 C) 98.6 F (37 C)  98.8 F (37.1 C)  TempSrc: Oral Oral  Oral  Resp: 17 12 12 16   Height: 5\' 7"  (1.702 m)     Weight: 108.863 kg (240 lb)     SpO2: 96% 96% 93% 96%     General:  A, O x3, NAD, obese  Eyes: PEERLA, EOMI  ENT: OMM, no thrush  Neck: NO LN or JVD  Cardiovascular: S1S2 reg, no m/r/g  Respiratory: CTAB, no w/r/c  Abdomen: NT, ND BS+  Skin: no rash or bruises  Musculoskeletal: FROM   Psychiatric: NO depression, good mood  Neurologic: No FND  Labs on Admission:  Basic Metabolic Panel:  Lab 10/28/12 1308  NA 135  K 3.9  CL 97  CO2 27  GLUCOSE 108*  BUN 15  CREATININE 0.66  CALCIUM 9.6  MG --  PHOS --   Liver Function Tests: No results found for this basename: AST:5,ALT:5,ALKPHOS:5,BILITOT:5,PROT:5,ALBUMIN:5 in the last 168 hours No results found for this  basename: LIPASE:5,AMYLASE:5 in the last 168 hours No results found for this basename: AMMONIA:5 in the last 168 hours CBC:  Lab 10/28/12 0448  WBC 11.8*  NEUTROABS 5.7  HGB 14.0  HCT 43.0  MCV 90.3  PLT 310   Cardiac Enzymes:  Lab 10/28/12 0746 10/28/12 0458  CKTOTAL -- --  CKMB -- --  CKMBINDEX -- --  TROPONINI <0.30 <0.30   BNP: No components found with this basename: POCBNP:5 CBG: No results found for this basename: GLUCAP:5 in the last 168 hours  Radiological Exams on Admission: Dg Chest 2 View  10/28/2012  *RADIOLOGY REPORT*  Clinical Data: Chest pressure, lightheaded, shortness of breath.  CHEST - 2 VIEW  Comparison: 12/15/2011.  Findings: Perihilar interstitial changes suggesting chronic bronchitis. The heart size and pulmonary vascularity are normal. The lungs appear clear and expanded without focal air space disease or consolidation. No blunting of the costophrenic angles.  No pneumothorax.  Mediastinal contours appear intact.  No significant change since previous study.  IMPRESSION: Chronic bronchitic changes.  No evidence of active pulmonary disease.   Original Report Authenticated By: Burman Nieves, M.D.     Time spent: 45 minutes  Edric Fetterman V. [facility] Pager [phone]  If 7PM-7AM, please contact night-coverage [email or web address] Password [email or web address] 10/28/2012, 12:19 PM

## 2012-10-28 NOTE — ED Notes (Signed)
Pt states she started having chest pressure about 45 minutes ago  , light headed ,  Sob and abdominal cramps

## 2012-10-28 NOTE — ED Notes (Signed)
Pt taken to radiology

## 2012-10-28 NOTE — ED Provider Notes (Signed)
Medical screening examination/treatment/procedure(s) were performed by non-physician practitioner and as supervising physician I was immediately available for consultation/collaboration.   Hanley Seamen, MD 10/28/12 [phone]

## 2012-10-28 NOTE — ED Notes (Signed)
Pt and family upset due to no update from anyone other than nurses since 5am. Due to time laspe  RN spoke with Dr Allena Katz and explained pt has been waiting for hours to go to room and now that it is almost time for second set of markers, can pt wait in ED, if neg be discharged. Dr Allena Katz in agreement, on his way in to see pt in next 1/2 hour to 45 min to discuss plan of care. Pt updated with plan and pleased.

## 2012-10-28 NOTE — ED Notes (Signed)
Paged Dr Patel(Team 3) for orders to move pt to floor

## 2012-10-28 NOTE — ED Provider Notes (Signed)
History     CSN: [id number]  Arrival date & time 10/28/12  4098   First MD Initiated Contact with Patient 10/28/12 0359      No chief complaint on file.   (Consider location/radiation/quality/duration/timing/severity/associated sxs/prior treatment) HPI History provided by pt.   Pt had acute onset, non-radiating left upper chest pressure, approx prior to arrival, while sitting down talking w/ friends.  Has been intermittent ever since.  When it is at it's highest intensity, it is associated w/ lightheadedness.  She has experienced SOB, upper abdominal cramping and chills as well.  Denies fever, cough, N/V, LE pain/edema.  Has had intermittent pains in left breast recently; no skin or nipple changes.  RF for ACS include FH and tobacco abuse.  Pt has taken one 81mg  aspirin.  She does not have pain currently.  No past medical history on file.  Past Surgical History  Procedure Date  . Abdominal hysterectomy   . Cholecystectomy   . Cervical discectomy   . Tonsillectomy     No family history on file.  History  Substance Use Topics  . Smoking status: Current Every Day Smoker -- 1.0 packs/day    Types: Cigarettes  . Smokeless tobacco: Not on file  . Alcohol Use: No    OB History    Grav Para Term Preterm Abortions TAB SAB Ect Mult Living                  Review of Systems  All other systems reviewed and are negative.    Allergies  Ceftin; Penicillins; Buspar; and Wellbutrin  Home Medications   Current Outpatient Rx  Name  Route  Sig  Dispense  Refill  . BIAXIN PO   Oral   Take 1 tablet by mouth 2 (two) times daily.           BP 130/68  Pulse 94  Temp 98.1 F (36.7 C) (Oral)  Resp 17  Ht 5\' 7"  (1.702 m)  Wt 240 lb (108.863 kg)  BMI 37.59 kg/m2  SpO2 96%  Physical Exam  Nursing note and vitals reviewed. Constitutional: She is oriented to person, place, and time. She appears well-developed and well-nourished. No distress.       obese  HENT:   Head: Normocephalic and atraumatic.  Eyes:       Normal appearance  Neck: Normal range of motion.  Cardiovascular: Normal rate, regular rhythm and intact distal pulses.   Pulmonary/Chest: Effort normal and breath sounds normal. No respiratory distress.       No pleuritic pain reported.  Mild tenderness in left breast.  No skin changes or discharge/bleeding from nipple.  Chest wall non-tender.   Abdominal: Soft. Bowel sounds are normal. She exhibits no distension. There is no tenderness. There is no guarding.  Musculoskeletal: Normal range of motion.       No peripheral edema or calf tenderness  Neurological: She is alert and oriented to person, place, and time.  Skin: Skin is warm and dry. No rash noted.  Psychiatric: She has a normal mood and affect. Her behavior is normal.    ED Course  Procedures (including critical care time)   Date: 10/28/2012  Rate: 92  Rhythm: normal sinus rhythm  QRS Axis: normal  Intervals: normal  ST/T Wave abnormalities: normal  Conduction Disutrbances:none  Narrative Interpretation:   Old EKG Reviewed: none available   Labs Reviewed  CBC WITH DIFFERENTIAL - Abnormal; Notable for the following:    WBC 11.8 (*)  Lymphs Abs 4.4 (*)     Monocytes Absolute 1.1 (*)     All other components within normal limits  BASIC METABOLIC PANEL - Abnormal; Notable for the following:    Glucose, Bld 108 (*)     All other components within normal limits  TROPONIN I   Dg Chest 2 View  10/28/2012  *RADIOLOGY REPORT*  Clinical Data: Chest pressure, lightheaded, shortness of breath.  CHEST - 2 VIEW  Comparison: 12/15/2011.  Findings: Perihilar interstitial changes suggesting chronic bronchitis. The heart size and pulmonary vascularity are normal. The lungs appear clear and expanded without focal air space disease or consolidation. No blunting of the costophrenic angles.  No pneumothorax.  Mediastinal contours appear intact.  No significant change since previous  study.  IMPRESSION: Chronic bronchitic changes.  No evidence of active pulmonary disease.   Original Report Authenticated By: Burman Nieves, M.D.      1. Chest pain       MDM  252 111 2099 F presents w/ c/o CP.  Typical and atypical features.  RF for ACS include tobacco abuse and FH.  No pain currently.  Pain is not reproducible on exam.  EKG non-ischemic.  Labs and CXR pending.  Pt to receive 162mg  aspirin; took one 81mg  prior to arrival.  5:03 AM   CXR and labs unremarkable.  Results discussed w/ pt.  Pt has had one single episode of CP since my encounter with her.  Currently pain free.  [facility] for admission for rule out.  6:29 AM        Otilio Miu, PA-C 10/28/12 [phone]

## 2012-10-28 NOTE — ED Provider Notes (Signed)
Melanie Oneal is a 46 y.o. female who came in for evaluation of chest pain. Earlier today. The pain is atypical for cardiac disease. She was evaluated by ED service, and [facility] consulted for admission. She has stayed in the emergency department, been seen by the hospitalist, and remains comfortable. Dr. Allena Katz, evaluated the patient and has ordered a third troponin. He feels that if that is negative, she can be discharged for outpatient followup for a cardiac stress test. She is TIMI 1.  Reevaluation: 14:15-she has not had recurrence of her chest pain. Her third troponin I is normal. Her vital signs are normal. I discussed the case with Dr. Patty Sermons, he agrees to see the patient in the office to do a stress test.   Diagnoses #1 atypical chest pain #2 tobacco abuse   Plan: Outpatient cardiac stress test. Daily aspirin. Return here if needed.   Results for orders placed during the hospital encounter of 10/28/12  CBC WITH DIFFERENTIAL      Component Value Range   WBC 11.8 (*) 4.0 - 10.5 K/uL   RBC 4.76  3.87 - 5.11 MIL/uL   Hemoglobin 14.0  12.0 - 15.0 g/dL   HCT 96.0  45.4 - 09.8 %   MCV 90.3  78.0 - 100.0 fL   MCH 29.4  26.0 - 34.0 pg   MCHC 32.6  30.0 - 36.0 g/dL   RDW 11.9  14.7 - 82.9 %   Platelets 310  150 - 400 K/uL   Neutrophils Relative 49  43 - 77 %   Neutro Abs 5.7  1.7 - 7.7 K/uL   Lymphocytes Relative 37  12 - 46 %   Lymphs Abs 4.4 (*) 0.7 - 4.0 K/uL   Monocytes Relative 9  3 - 12 %   Monocytes Absolute 1.1 (*) 0.1 - 1.0 K/uL   Eosinophils Relative 4  0 - 5 %   Eosinophils Absolute 0.5  0.0 - 0.7 K/uL   Basophils Relative 1  0 - 1 %   Basophils Absolute 0.1  0.0 - 0.1 K/uL  BASIC METABOLIC PANEL      Component Value Range   Sodium 135  135 - 145 mEq/L   Potassium 3.9  3.5 - 5.1 mEq/L   Chloride 97  96 - 112 mEq/L   CO2 27  19 - 32 mEq/L   Glucose, Bld 108 (*) 70 - 99 mg/dL   BUN 15  6 - 23 mg/dL   Creatinine, Ser 5.62  0.50 - 1.10 mg/dL   Calcium 9.6   8.4 - 13.0 mg/dL   GFR calc non Af Amer >90  >90 mL/min   GFR calc Af Amer >90  >90 mL/min  TROPONIN I      Component Value Range   Troponin I <0.30  <0.30 ng/mL  TROPONIN I      Component Value Range   Troponin I <0.30  <0.30 ng/mL  TROPONIN I      Component Value Range   Troponin I <0.30  <0.30 ng/mL    Dg Chest 2 View  10/28/2012  *RADIOLOGY REPORT*  Clinical Data: Chest pressure, lightheaded, shortness of breath.  CHEST - 2 VIEW  Comparison: 12/15/2011.  Findings: Perihilar interstitial changes suggesting chronic bronchitis. The heart size and pulmonary vascularity are normal. The lungs appear clear and expanded without focal air space disease or consolidation. No blunting of the costophrenic angles.  No pneumothorax.  Mediastinal contours appear intact.  No significant change since previous  study.  IMPRESSION: Chronic bronchitic changes.  No evidence of active pulmonary disease.   Original Report Authenticated By: Burman Nieves, M.D.    Flint Melter, MD 10/28/12 [phone]

## 2012-10-28 NOTE — ED Notes (Addendum)
Pt complains of feeling a warmth when she needs to urinate.  Pt states that she has not burning sensation when urinating.  Pt states that her chest pain is a uncomfortable feeling

## 2014-01-10 ENCOUNTER — Encounter (HOSPITAL_COMMUNITY): Payer: Self-pay | Admitting: Emergency Medicine

## 2014-01-10 ENCOUNTER — Emergency Department (HOSPITAL_COMMUNITY): Payer: Self-pay

## 2014-01-10 ENCOUNTER — Emergency Department (HOSPITAL_COMMUNITY)
Admission: EM | Admit: 2014-01-10 | Discharge: 2014-01-10 | Disposition: A | Discharge status: Home / Self Care | Payer: Self-pay | Attending: Emergency Medicine | Admitting: Emergency Medicine

## 2014-01-10 DIAGNOSIS — E669 Obesity, unspecified: Secondary | ICD-10-CM | POA: Insufficient documentation

## 2014-01-10 DIAGNOSIS — F17200 Nicotine dependence, unspecified, uncomplicated: Secondary | ICD-10-CM | POA: Insufficient documentation

## 2014-01-10 DIAGNOSIS — Z880 Allergy status to penicillin: Secondary | ICD-10-CM | POA: Insufficient documentation

## 2014-01-10 DIAGNOSIS — R0602 Shortness of breath: Secondary | ICD-10-CM | POA: Insufficient documentation

## 2014-01-10 DIAGNOSIS — Z90710 Acquired absence of both cervix and uterus: Secondary | ICD-10-CM | POA: Insufficient documentation

## 2014-01-10 DIAGNOSIS — R0789 Other chest pain: Secondary | ICD-10-CM | POA: Insufficient documentation

## 2014-01-10 LAB — I-STAT CHEM 8, ED
BUN: 9 mg/dL (ref 6–23)
CHLORIDE: 99 meq/L (ref 96–112)
CREATININE: 0.7 mg/dL (ref 0.50–1.10)
Calcium, Ion: 1.22 mmol/L (ref 1.12–1.23)
Glucose, Bld: 105 mg/dL — ABNORMAL HIGH (ref 70–99)
HCT: 45 % (ref 36.0–46.0)
Hemoglobin: 15.3 g/dL — ABNORMAL HIGH (ref 12.0–15.0)
POTASSIUM: 4.3 meq/L (ref 3.7–5.3)
SODIUM: 140 meq/L (ref 137–147)
TCO2: 29 mmol/L (ref 0–100)

## 2014-01-10 LAB — CBC WITH DIFFERENTIAL/PLATELET
Basophils Absolute: 0.1 10*3/uL (ref 0.0–0.1)
Basophils Relative: 1 % (ref 0–1)
EOS ABS: 0.4 10*3/uL (ref 0.0–0.7)
Eosinophils Relative: 4 % (ref 0–5)
HCT: 41.8 % (ref 36.0–46.0)
HEMOGLOBIN: 14.2 g/dL (ref 12.0–15.0)
LYMPHS ABS: 3.5 10*3/uL (ref 0.7–4.0)
LYMPHS PCT: 32 % (ref 12–46)
MCH: 30.7 pg (ref 26.0–34.0)
MCHC: 34 g/dL (ref 30.0–36.0)
MCV: 90.5 fL (ref 78.0–100.0)
MONOS PCT: 7 % (ref 3–12)
Monocytes Absolute: 0.7 10*3/uL (ref 0.1–1.0)
NEUTROS PCT: 56 % (ref 43–77)
Neutro Abs: 6 10*3/uL (ref 1.7–7.7)
Platelets: 296 10*3/uL (ref 150–400)
RBC: 4.62 MIL/uL (ref 3.87–5.11)
RDW: 14.1 % (ref 11.5–15.5)
WBC: 10.7 10*3/uL — AB (ref 4.0–10.5)

## 2014-01-10 LAB — I-STAT TROPONIN, ED
TROPONIN I, POC: 0 ng/mL (ref 0.00–0.08)
Troponin i, poc: 0 ng/mL (ref 0.00–0.08)

## 2014-01-10 LAB — BASIC METABOLIC PANEL
BUN: 10 mg/dL (ref 6–23)
CO2: 26 meq/L (ref 19–32)
Calcium: 9.8 mg/dL (ref 8.4–10.5)
Chloride: 100 mEq/L (ref 96–112)
Creatinine, Ser: 0.62 mg/dL (ref 0.50–1.10)
GFR calc Af Amer: >90 mL/min (ref >90)
GLUCOSE: 100 mg/dL — AB (ref 70–99)
POTASSIUM: 4.3 meq/L (ref 3.7–5.3)
Sodium: 138 mEq/L (ref 137–147)

## 2014-01-10 NOTE — ED Provider Notes (Signed)
CSN: [id number]     Arrival date & time 01/10/14  1245 History   First MD Initiated Contact with Patient 01/10/14 1326     Chief Complaint  Patient presents with  . Chest Pain     (Consider location/radiation/quality/duration/timing/severity/associated sxs/prior Treatment) HPI Comments: Patient is a 48 year old female with history of abdominal hysterectomy who presents today with sudden onset shortness of breath and chest pain. She describes the chest pain as a pressure. It radiated into her left arm. She did not have any associated diaphoresis or nausea. She did feel lightheaded. The pain woke her up from sleep at 830 or 9am this morning. She currently feels improved. Her symptoms lasted approximately 2 hours and improved after EMS gave her nitro and asa. She's had feelings like this in the past. Most recently was approximately one year ago which was seen at [facility]. She reports that her laboratory testing came back negative and she was sent home. She has never seen a cardiologist before. She had a stress test once many years ago which was normal. She denies any family history of early cardiac disease. She does smoke at least one pack of cigarettes daily.  Patient is a 48 y.o. female presenting with chest pain. The history is provided by the patient. No language interpreter was used.  Chest Pain Associated symptoms: shortness of breath   Associated symptoms: no abdominal pain, no diaphoresis, no fatigue, no fever, no nausea and not vomiting     History reviewed. No pertinent past medical history. Past Surgical History  Procedure Laterality Date  . Abdominal hysterectomy    . Cholecystectomy    . Cervical discectomy    . Tonsillectomy     History reviewed. No pertinent family history. History  Substance Use Topics  . Smoking status: Current Every Day Smoker -- 1.00 packs/day    Types: Cigarettes  . Smokeless tobacco: Not on file  . Alcohol Use: No   OB History   Grav Para Term  Preterm Abortions TAB SAB Ect Mult Living                 Review of Systems  Constitutional: Negative for fever, chills, diaphoresis and fatigue.  Respiratory: Positive for shortness of breath.   Cardiovascular: Positive for chest pain. Negative for leg swelling.  Gastrointestinal: Negative for nausea, vomiting and abdominal pain.  All other systems reviewed and are negative.      Allergies  Ceftin; Penicillins; Buspar; and Wellbutrin  Home Medications  No current outpatient prescriptions on file. BP 125/64  Pulse 85  Temp(Src) 98 F (36.7 C) (Oral)  Resp 17  SpO2 95% Physical Exam  Nursing note and vitals reviewed. Constitutional: She is oriented to person, place, and time. She appears well-developed and well-nourished. She does not appear ill. No distress.  Laying comfortably in bed obese  HENT:  Head: Normocephalic and atraumatic.  Right Ear: External ear normal.  Left Ear: External ear normal.  Nose: Nose normal.  Mouth/Throat: Oropharynx is clear and moist.  Eyes: Conjunctivae are normal.  Neck: Normal range of motion.  Cardiovascular: Normal rate, regular rhythm, normal heart sounds, intact distal pulses and normal pulses.   Pulmonary/Chest: Effort normal and breath sounds normal. No stridor. No respiratory distress. She has no wheezes. She has no rales.  Abdominal: Soft. She exhibits no distension.  Musculoskeletal: Normal range of motion.  Neurological: She is alert and oriented to person, place, and time. She has normal strength.  Skin: Skin is warm and  dry. She is not diaphoretic. No erythema.  Psychiatric: She has a normal mood and affect. Her behavior is normal.    ED Course  Procedures (including critical care time) Labs Review Labs Reviewed  CBC WITH DIFFERENTIAL - Abnormal; Notable for the following:    WBC 10.7 (*)    All other components within normal limits  BASIC METABOLIC PANEL - Abnormal; Notable for the following:    Glucose, Bld 100 (*)     All other components within normal limits  I-STAT CHEM 8, ED - Abnormal; Notable for the following:    Glucose, Bld 105 (*)    Hemoglobin 15.3 (*)    All other components within normal limits  I-STAT TROPOININ, ED  Rosezena Sensor, ED   Imaging Review Dg Chest 2 View  01/10/2014   CLINICAL DATA:  Chest tightness.  Short of breath.  EXAM: CHEST  2 VIEW  COMPARISON:  10/31/2012  FINDINGS: The heart size and mediastinal contours are within normal limits. Both lungs are clear. The visualized skeletal structures are unremarkable.  IMPRESSION: No active cardiopulmonary disease.   Electronically Signed   By: Amie Portland M.D.   On: 01/10/2014 14:00    EKG Interpretation  Date/Time:  Friday January 10 2014 13:11:43 EST Ventricular Rate:  83 PR Interval:  191 QRS Duration: 72 QT Interval:  359 QTC Calculation: 422 R Axis:   49 Text Interpretation:  Sinus rhythm since last tracing no significant change Confirmed by Effie Shy  MD, Mechele Collin ([id number]) on 01/10/2014 5:04:44 PM   MDM   Final diagnoses:  Atypical chest pain   Patient is to be discharged with recommendation to follow up with PCP in regards to today's hospital visit. Chest pain is not likely of cardiac or pulmonary etiology d/t presentation, VSS, no tracheal deviation, no JVD or new murmur, RRR, breath sounds equal bilaterally, EKG without acute abnormalities, negative troponin x2, and negative CXR. Pt has been advised start a PPI and return to the ED is CP becomes exertional, associated with diaphoresis or nausea, radiates to left jaw/arm, worsens or becomes concerning in any way. Pt appears reliable for follow up and is agreeable to discharge.   Case has been discussed with and seen by Dr. Effie Shy who agrees with the above plan to discharge.      Mora Bellman, PA-C 01/10/14 1717  Mora Bellman, PA-C 01/10/14 [phone]

## 2014-01-10 NOTE — ED Provider Notes (Signed)
  Face-to-face evaluation   History: Episode of chest tightness earlier today. She is a smoker. No chronic chest pain. Low risk cardiac profile  Physical exam: Alert, calm, cooperative, in no apparent distress.  Medical screening examination/treatment/procedure(s) were conducted as a shared visit with non-physician practitioner(s) and myself.  I personally evaluated the patient during the encounter  Flint MelterElliott L Cindia Hustead, MD 01/10/14 2040

## 2014-01-10 NOTE — ED Notes (Addendum)
Pt reports to the ED from home via [facility]. Pt awoke from sleep with chest pain and pressure. She describes the pain as "someone sitting on her chest." Pt also reports the pain radiated into her left arm and she had some tingling in her left arm. 12 lead unremarkable. Pt received 2 nitros and 324 of ASA. Pt reports the nitro resolved her pain. Pt reports the pain is constant but the severity changes. Pt denies any N/V or diaphoresis. Pt reports some lightheadedness and SOB with the chest tightness. Pt A&Ox4, resp e/u, and skin warm and dry.

## 2014-01-10 NOTE — Discharge Instructions (Signed)
Chest Pain (Nonspecific) Chest pain has many causes. Your pain could be caused by something serious, such as a heart attack or a blood clot in the lungs. It could also be caused by something less serious, such as a chest bruise or a virus. Follow up with your doctor. More lab tests or other studies may be needed to find the cause of your pain. Most of the time, nonspecific chest pain will improve within 2 to 3 days of rest and mild pain medicine. HOME CARE  For chest bruises, you may put ice on the sore area for 15-20 minutes, 03-04 times a day. Do this only if it makes you feel better.  Put ice in a plastic bag.  Place a towel between the skin and the bag.  Rest for the next 2 to 3 days.  Go back to work if the pain improves.  See your doctor if the pain lasts longer than 1 to 2 weeks.  Only take medicine as told by your doctor.  Quit smoking if you smoke. GET HELP RIGHT AWAY IF:   There is more pain or pain that spreads to the arm, neck, jaw, back, or belly (abdomen).  You have shortness of breath.  You cough more than usual or cough up blood.  You have very bad back or belly pain, feel sick to your stomach (nauseous), or throw up (vomit).  You have very bad weakness.  You pass out (faint).  You have a fever. Any of these problems may be serious and may be an emergency. Do not wait to see if the problems will go away. Get medical help right away. Call your local emergency services 911 in [place] Do not drive yourself to the hospital. MAKE SURE YOU:   Understand these instructions.  Will watch this condition.  Will get help right away if you or your child is not doing well or gets worse. Document Released: 04/18/2008 Document Revised: 01/23/2012 Document Reviewed: 04/18/2008 [facility] Patient Information 2014 [place], [place].  Chest Pain Observation It is often hard to give a specific diagnosis for the cause of chest pain. Among other possibilities your symptoms might be  caused by inadequate oxygen delivery to your heart (angina). Angina that is not treated or evaluated can lead to a heart attack (myocardial infarction) or death. Blood tests, electrocardiograms, and X-rays may have been done to help determine a possible cause of your chest pain. After evaluation and observation, your health care provider has determined that it is unlikely your pain was caused by an unstable condition that requires hospitalization. However, a full evaluation of your pain may need to be completed, with additional diagnostic testing as directed. It is very important to keep your follow-up appointments. Not keeping your follow-up appointments could result in permanent heart damage, disability, or death. If there is any problem keeping your follow-up appointments, you must call your health care provider. HOME CARE INSTRUCTIONS  Due to the slight chance that your pain could be angina, it is important to follow your health care provider's treatment plan and also maintain a healthy lifestyle:  Maintain or work toward achieving a healthy weight.  Stay physically active and exercise regularly.  Decrease your salt intake.  Eat a balanced, healthy diet. Talk to a dietician to learn about heart healthy foods.  Increase your fiber intake by including whole grains, vegetables, fruits, and nuts in your diet.  Avoid situations that cause stress, anger, or depression.  Take medicines as advised by your health  care provider. Report any side effects to your health care provider. Do not stop medicines or adjust the dosages on your own.  Quit smoking. Do not use nicotine patches or gum until you check with your health care provider.  Keep your blood pressure, blood sugar, and cholesterol levels within normal limits.  Limit alcohol intake to no more than 1 drink per day for women that are not pregnant and 2 drinks per day for men.  Do not abuse drugs. SEEK IMMEDIATE MEDICAL CARE IF: You have  severe chest pain or pressure which may include symptoms such as:  You feel pain or pressure in you arms, neck, jaw, or back.  You have severe back or abdominal pain, feel sick to your stomach (nauseous), or throw up (vomit).  You are sweating profusely.  You are having a fast or irregular heartbeat.  You feel short of breath while at rest.  You notice increasing shortness of breath during rest, sleep, or with activity.  You have chest pain that does not get better after rest or after taking your usual medicine.  You wake from sleep with chest pain.  You are unable to sleep because you cannot breathe.  You develop a frequent cough or you are coughing up blood.  You feel dizzy, faint, or experience extreme fatigue.  You develop severe weakness, dizziness, fainting, or chills. Any of these symptoms may represent a serious problem that is an emergency. Do not wait to see if the symptoms will go away. Call your local emergency services (911 in the [place] Do not drive yourself to the hospital. MAKE SURE YOU:  Understand these instructions.  Will watch your condition.  Will get help right away if you are not doing well or get worse. Document Released: 12/03/2010 Document Revised: 07/03/2013 Document Reviewed: 05/02/2013 [facility] Patient Information 2014 [place], [place].

## 2014-02-21 DIAGNOSIS — F329 Major depressive disorder, single episode, unspecified: Secondary | ICD-10-CM | POA: Insufficient documentation

## 2014-02-21 DIAGNOSIS — Z87410 Personal history of cervical dysplasia: Secondary | ICD-10-CM

## 2014-02-21 HISTORY — DX: Personal history of cervical dysplasia: Z87.410

## 2014-02-21 HISTORY — DX: Major depressive disorder, single episode, unspecified: F32.9

## 2014-08-21 ENCOUNTER — Emergency Department (HOSPITAL_COMMUNITY)
Admission: EM | Admit: 2014-08-21 | Discharge: 2014-08-22 | Disposition: A | Discharge status: Home / Self Care | Payer: Self-pay | Attending: Emergency Medicine | Admitting: Emergency Medicine

## 2014-08-21 ENCOUNTER — Encounter (HOSPITAL_COMMUNITY): Payer: Self-pay | Admitting: Emergency Medicine

## 2014-08-21 ENCOUNTER — Emergency Department (HOSPITAL_COMMUNITY): Payer: Self-pay

## 2014-08-21 DIAGNOSIS — S2020XA Contusion of thorax, unspecified, initial encounter: Secondary | ICD-10-CM | POA: Insufficient documentation

## 2014-08-21 DIAGNOSIS — Y9389 Activity, other specified: Secondary | ICD-10-CM | POA: Insufficient documentation

## 2014-08-21 DIAGNOSIS — Y9289 Other specified places as the place of occurrence of the external cause: Secondary | ICD-10-CM | POA: Insufficient documentation

## 2014-08-21 DIAGNOSIS — S0990XA Unspecified injury of head, initial encounter: Secondary | ICD-10-CM | POA: Insufficient documentation

## 2014-08-21 DIAGNOSIS — S5002XA Contusion of left elbow, initial encounter: Secondary | ICD-10-CM | POA: Insufficient documentation

## 2014-08-21 DIAGNOSIS — W010XXA Fall on same level from slipping, tripping and stumbling without subsequent striking against object, initial encounter: Secondary | ICD-10-CM | POA: Insufficient documentation

## 2014-08-21 DIAGNOSIS — T07XXXA Unspecified multiple injuries, initial encounter: Secondary | ICD-10-CM

## 2014-08-21 DIAGNOSIS — Z720 Tobacco use: Secondary | ICD-10-CM | POA: Insufficient documentation

## 2014-08-21 DIAGNOSIS — S7002XA Contusion of left hip, initial encounter: Secondary | ICD-10-CM | POA: Insufficient documentation

## 2014-08-21 DIAGNOSIS — S1093XA Contusion of unspecified part of neck, initial encounter: Secondary | ICD-10-CM | POA: Insufficient documentation

## 2014-08-21 DIAGNOSIS — S9002XA Contusion of left ankle, initial encounter: Secondary | ICD-10-CM | POA: Insufficient documentation

## 2014-08-21 DIAGNOSIS — S20229A Contusion of unspecified back wall of thorax, initial encounter: Secondary | ICD-10-CM | POA: Insufficient documentation

## 2014-08-21 DIAGNOSIS — W19XXXA Unspecified fall, initial encounter: Secondary | ICD-10-CM

## 2014-08-21 MED ORDER — OXYCODONE-ACETAMINOPHEN 5-325 MG PO TABS
2.0000 | ORAL_TABLET | Freq: Once | ORAL | Status: AC
  Administered 2014-08-22: 2 via ORAL
  Filled 2014-08-21: qty 2

## 2014-08-21 MED ORDER — IBUPROFEN 800 MG PO TABS
800.0000 mg | ORAL_TABLET | Freq: Once | ORAL | Status: AC
Start: 2014-08-21 — End: 2014-08-22
  Administered 2014-08-22: 800 mg via ORAL
  Filled 2014-08-21: qty 1

## 2014-08-21 NOTE — ED Notes (Signed)
Pt states she fell at the [employer] in [place]  Pt is c/o neck pain and pt has had a discectomy and fusion and pt states she is having pain in neck and numbness in her fingertips  Pt is also c/o pain in her lower back, left hip, left ankle, left elbow   Pt states they had mopped and there were no signs out

## 2014-08-21 NOTE — ED Provider Notes (Signed)
TIME SEEN: 11:35 PM  CHIEF COMPLAINT: Fall  HPI: Patient is a 48 y.o. F with history of obesity and prior cervical spine discectomy approximately 20 years ago who presents to the emergency department with complaints of neck pain, diffuse back pain, left hip pain, left ankle pain, left elbow pain, chest wall pain after she had a fall at a check for late today. She states she was coming out of the bathroom and slipped on wet floor. She is not sure how she landed but denies loss of consciousness. She is not on anticoagulation. She has had some numbness and tingling in her second through fifth fingers bilaterally. No weakness. No shortness of breath. No abdominal pain.  ROS: See HPI Constitutional: no fever  Eyes: no drainage  ENT: no runny nose   Cardiovascular:  no chest pain  Resp: no SOB  GI: no vomiting GU: no dysuria Integumentary: no rash  Allergy: no hives  Musculoskeletal: no leg swelling  Neurological: no slurred speech ROS otherwise negative  PAST MEDICAL HISTORY/PAST SURGICAL HISTORY:  History reviewed. No pertinent past medical history.  MEDICATIONS:  Prior to Admission medications   Not on File    ALLERGIES:  Allergies  Allergen Reactions  . Ceftin Anaphylaxis  . Penicillins Anaphylaxis  . Buspar [Buspirone Hcl] Hives  . Wellbutrin [Bupropion Hcl] Hives    SOCIAL HISTORY:  History  Substance Use Topics  . Smoking status: Current Every Day Smoker -- 1.00 packs/day    Types: Cigarettes  . Smokeless tobacco: Not on file  . Alcohol Use: No    FAMILY HISTORY: Family History  Problem Relation Age of Onset  . Diabetes Other     EXAM: BP 142/76  Pulse 96  Temp(Src) 98.4 F (36.9 C) (Oral)  Resp 20  SpO2 94% CONSTITUTIONAL: Alert and oriented and responds appropriately to questions. Well-appearing; well-nourished; GCS 15 HEAD: Normocephalic; atraumatic EYES: Conjunctivae clear, PERRL, EOMI ENT: normal nose; no rhinorrhea; moist mucous membranes; pharynx  without lesions noted; no dental injury; no hemotypanum; no septal hematoma NECK: Supple, no meningismus, no LAD; no midline spinal tenderness, step-off or deformity; cervical collar in place CARD: RRR; S1 and S2 appreciated; no murmurs, no clicks, no rubs, no gallops RESP: Normal chest excursion without splinting or tachypnea; breath sounds clear and equal bilaterally; no wheezes, no rhonchi, no rales; chest wall stable, diffusely tender to palpation without crepitus or ecchymosis, no flail chest ABD/GI: Normal bowel sounds; non-distended; soft, non-tender, no rebound, no guarding PELVIS:  stable, nontender to palpation BACK:  The back appears normal and is tender to palpation diffusely with midline spinal tenderness diffusely throughout the cervical, thoracic and lumbar spine without step-off or deformity, no lesions noted on the back EXT: Tender to palpation diffusely over the left shoulder, left elbow, left ankle and left hip without obvious bony deformity or swelling or ecchymosis, joint effusion, compartments are soft, 2+ DP and radial pulses bilaterally, sensation to light touch intact diffusely, Normal ROM in all joints; otherwise extremities are non-tender to palpation; no edema; normal capillary refill; no cyanosis    SKIN: Normal color for age and race; warm NEURO: Moves all extremities equally; patient reports some tingling in her second through fifth fingers of her bilateral hands but there is no sensory deficit on exam, normal grip strength, moves all extremity equally, normal gait, 2+ deep tendon reflexes in bilateral upper and lower extreme venous, no clonus, normal Babinski PSYCH: The patient's mood and manner are appropriate. Grooming and personal hygiene are  appropriate.  MEDICAL DECISION MAKING: Patient here with mechanical fall. She is complaining of diffuse pain. We'll obtain imaging. We'll give pain medication. She is currently in a c-collar.  ED PROGRESS: Imaging is  unremarkable. Tingling in her fingertips has improved. C-spine cleared clinically. She still complaining of some pain. We'll give IM Dilaudid and reassess.   2:40 AM  Pt's pain is markedly improved after IM Dilaudid. We'll discharge him with ibuprofen, Percocet. Discussed strict return precautions. She verbalized understanding and is comfortable with plan.  Layla MawKristen N Ward, DO 08/22/14 [phone]

## 2014-08-22 ENCOUNTER — Emergency Department (HOSPITAL_COMMUNITY): Payer: Self-pay

## 2014-08-22 MED ORDER — OXYCODONE-ACETAMINOPHEN 5-325 MG PO TABS: 1.0000 | ORAL_TABLET | ORAL | Status: DC | PRN

## 2014-08-22 MED ORDER — ONDANSETRON 4 MG PO TBDP
4.0000 mg | ORAL_TABLET | Freq: Once | ORAL | Status: AC
  Administered 2014-08-22: 4 mg via ORAL
  Filled 2014-08-22: qty 1

## 2014-08-22 MED ORDER — IBUPROFEN 800 MG PO TABS: 800.0000 mg | ORAL_TABLET | Freq: Three times a day (TID) | ORAL | Status: DC | PRN

## 2014-08-22 MED ORDER — HYDROMORPHONE HCL 1 MG/ML IJ SOLN
1.0000 mg | Freq: Once | INTRAMUSCULAR | Status: AC
  Administered 2014-08-22: 1 mg via INTRAMUSCULAR
  Filled 2014-08-22: qty 1

## 2014-08-22 NOTE — Discharge Instructions (Signed)
Contusion A contusion is a deep bruise. Contusions happen when an injury causes bleeding under the skin. Signs of bruising include pain, puffiness (swelling), and discolored skin. The contusion may turn blue, purple, or yellow. HOME CARE   Put ice on the injured area.  Put ice in a plastic bag.  Place a towel between your skin and the bag.  Leave the ice on for 15-20 minutes, 03-04 times a day.  Only take medicine as told by your doctor.  Rest the injured area.  If possible, raise (elevate) the injured area to lessen puffiness. GET HELP RIGHT AWAY IF:   You have more bruising or puffiness.  You have pain that is getting worse.  Your puffiness or pain is not helped by medicine. MAKE SURE YOU:   Understand these instructions.  Will watch your condition.  Will get help right away if you are not doing well or get worse. Document Released: 04/18/2008 Document Revised: 01/23/2012 Document Reviewed: 09/05/2011 [facility] Patient Information 2015 [place], [place]. This information is not intended to replace advice given to you by your health care provider. Make sure you discuss any questions you have with your health care provider.  Head Injury You have received a head injury. It does not appear serious at this time. Headaches and vomiting are common following head injury. It should be easy to awaken from sleeping. Sometimes it is necessary for you to stay in the emergency department for a while for observation. Sometimes admission to the hospital may be needed. After injuries such as yours, most problems occur within the first 24 hours, but side effects may occur up to 7-10 days after the injury. It is important for you to carefully monitor your condition and contact your health care provider or seek immediate medical care if there is a change in your condition. WHAT ARE THE TYPES OF HEAD INJURIES? Head injuries can be as minor as a bump. Some head injuries can be more severe. More severe  head injuries include:  A jarring injury to the brain (concussion).  A bruise of the brain (contusion). This mean there is bleeding in the brain that can cause swelling.  A cracked skull (skull fracture).  Bleeding in the brain that collects, clots, and forms a bump (hematoma). WHAT CAUSES A HEAD INJURY? A serious head injury is most likely to happen to someone who is in a car wreck and is not wearing a seat belt. Other causes of major head injuries include bicycle or motorcycle accidents, sports injuries, and falls. HOW ARE HEAD INJURIES DIAGNOSED? A complete history of the event leading to the injury and your current symptoms will be helpful in diagnosing head injuries. Many times, pictures of the brain, such as CT or MRI are needed to see the extent of the injury. Often, an overnight hospital stay is necessary for observation.  WHEN SHOULD I SEEK IMMEDIATE MEDICAL CARE?  You should get help right away if:  You have confusion or drowsiness.  You feel sick to your stomach (nauseous) or have continued, forceful vomiting.  You have dizziness or unsteadiness that is getting worse.  You have severe, continued headaches not relieved by medicine. Only take over-the-counter or prescription medicines for pain, fever, or discomfort as directed by your health care provider.  You do not have normal function of the arms or legs or are unable to walk.  You notice changes in the black spots in the center of the colored part of your eye (pupil).  You have  a clear or bloody fluid coming from your nose or ears.  You have a loss of vision. During the next 24 hours after the injury, you must stay with someone who can watch you for the warning signs. This person should contact local emergency services (911 in the [place] if you have seizures, you become unconscious, or you are unable to wake up. HOW CAN I PREVENT A HEAD INJURY IN THE FUTURE? The most important factor for preventing major head injuries is  avoiding motor vehicle accidents. To minimize the potential for damage to your head, it is crucial to wear seat belts while riding in motor vehicles. Wearing helmets while bike riding and playing collision sports (like football) is also helpful. Also, avoiding dangerous activities around the house will further help reduce your risk of head injury.  WHEN CAN I RETURN TO NORMAL ACTIVITIES AND ATHLETICS? You should be reevaluated by your health care provider before returning to these activities. If you have any of the following symptoms, you should not return to activities or contact sports until 1 week after the symptoms have stopped:  Persistent headache.  Dizziness or vertigo.  Poor attention and concentration.  Confusion.  Memory problems.  Nausea or vomiting.  Fatigue or tire easily.  Irritability.  Intolerant of bright lights or loud noises.  Anxiety or depression.  Disturbed sleep. MAKE SURE YOU:   Understand these instructions.  Will watch your condition.  Will get help right away if you are not doing well or get worse. Document Released: 10/31/2005 Document Revised: 11/05/2013 Document Reviewed: 07/08/2013 [facility] Patient Information 2015 [place], [place]. This information is not intended to replace advice given to you by your health care provider. Make sure you discuss any questions you have with your health care provider.   RICE: Routine Care for Injuries The routine care of many injuries includes Rest, Ice, Compression, and Elevation (RICE). HOME CARE INSTRUCTIONS  Rest is needed to allow your body to heal. Routine activities can usually be resumed when comfortable. Injured tendons and bones can take up to 6 weeks to heal. Tendons are the cord-like structures that attach muscle to bone.  Ice following an injury helps keep the swelling down and reduces pain.  Put ice in a plastic bag.  Place a towel between your skin and the bag.  Leave the ice on for 15-20  minutes, 3-4 times a day, or as directed by your health care provider. Do this while awake, for the first 24 to 48 hours. After that, continue as directed by your caregiver.  Compression helps keep swelling down. It also gives support and helps with discomfort. If an elastic bandage has been applied, it should be removed and reapplied every 3 to 4 hours. It should not be applied tightly, but firmly enough to keep swelling down. Watch fingers or toes for swelling, bluish discoloration, coldness, numbness, or excessive pain. If any of these problems occur, remove the bandage and reapply loosely. Contact your caregiver if these problems continue.  Elevation helps reduce swelling and decreases pain. With extremities, such as the arms, hands, legs, and feet, the injured area should be placed near or above the level of the heart, if possible. SEEK IMMEDIATE MEDICAL CARE IF:  You have persistent pain and swelling.  You develop redness, numbness, or unexpected weakness.  Your symptoms are getting worse rather than improving after several days. These symptoms may indicate that further evaluation or further X-rays are needed. Sometimes, X-rays may not show a small broken  bone (fracture) until 1 week or 10 days later. Make a follow-up appointment with your caregiver. Ask when your X-ray results will be ready. Make sure you get your X-ray results. Document Released: 02/12/2001 Document Revised: 11/05/2013 Document Reviewed: 04/01/2011 [facility] Patient Information 2015 [place], [place]. This information is not intended to replace advice given to you by your health care provider. Make sure you discuss any questions you have with your health care provider.

## 2015-02-26 DIAGNOSIS — R5383 Other fatigue: Secondary | ICD-10-CM

## 2015-02-26 DIAGNOSIS — N63 Unspecified lump in unspecified breast: Secondary | ICD-10-CM

## 2015-02-26 DIAGNOSIS — R61 Generalized hyperhidrosis: Secondary | ICD-10-CM

## 2015-02-26 HISTORY — DX: Generalized hyperhidrosis: R61

## 2015-02-26 HISTORY — DX: Other fatigue: R53.83

## 2015-02-26 HISTORY — DX: Unspecified lump in unspecified breast: N63.0

## 2015-02-27 DIAGNOSIS — R7303 Prediabetes: Secondary | ICD-10-CM

## 2015-02-27 HISTORY — DX: Prediabetes: R73.03

## 2015-12-28 DIAGNOSIS — M5030 Other cervical disc degeneration, unspecified cervical region: Secondary | ICD-10-CM

## 2015-12-28 HISTORY — DX: Other cervical disc degeneration, unspecified cervical region: M50.30

## 2016-01-22 ENCOUNTER — Other Ambulatory Visit: Payer: Self-pay | Admitting: Internal Medicine

## 2016-01-22 DIAGNOSIS — N029 Recurrent and persistent hematuria with unspecified morphologic changes: Secondary | ICD-10-CM

## 2016-01-25 DIAGNOSIS — M25569 Pain in unspecified knee: Secondary | ICD-10-CM | POA: Insufficient documentation

## 2016-01-25 HISTORY — DX: Pain in unspecified knee: M25.569

## 2016-01-28 ENCOUNTER — Ambulatory Visit: Payer: Managed Care, Other (non HMO)

## 2016-01-29 ENCOUNTER — Ambulatory Visit: Payer: Managed Care, Other (non HMO)

## 2016-02-03 ENCOUNTER — Ambulatory Visit
Admission: RE | Admit: 2016-02-03 | Discharge: 2016-02-03 | Disposition: A | Discharge status: Home / Self Care | Payer: Managed Care, Other (non HMO) | Source: Ambulatory Visit | Attending: Internal Medicine | Admitting: Internal Medicine

## 2016-02-03 DIAGNOSIS — N029 Recurrent and persistent hematuria with unspecified morphologic changes: Secondary | ICD-10-CM | POA: Diagnosis not present

## 2016-02-09 ENCOUNTER — Encounter: Payer: Self-pay | Admitting: Urology

## 2016-02-09 ENCOUNTER — Ambulatory Visit (INDEPENDENT_AMBULATORY_CARE_PROVIDER_SITE_OTHER): Payer: Managed Care, Other (non HMO) | Admitting: Urology

## 2016-02-09 VITALS — BP 129/82 | HR 94 | Temp 99.2°F | Ht 66.0 in | Wt 217.9 lb

## 2016-02-09 DIAGNOSIS — R3129 Other microscopic hematuria: Secondary | ICD-10-CM | POA: Diagnosis not present

## 2016-02-09 LAB — URINALYSIS, COMPLETE
Bilirubin, UA: NEGATIVE
GLUCOSE, UA: NEGATIVE
Ketones, UA: NEGATIVE
Leukocytes, UA: NEGATIVE
NITRITE UA: NEGATIVE
PH UA: 6 (ref 5.0–7.5)
Specific Gravity, UA: 1.015 (ref 1.005–1.030)
UUROB: 0.2 mg/dL (ref 0.2–1.0)

## 2016-02-09 LAB — MICROSCOPIC EXAMINATION
Bacteria, UA: NONE SEEN
WBC UA: NONE SEEN /HPF (ref 0–?)

## 2016-02-09 NOTE — Progress Notes (Signed)
02/09/2016 9:06 AM   Melanie Oneal 10-16-1966 [id number]  Referring provider: Marguarite Arbour, MD [address] [facility] [place], [place] [address]  Chief Complaint  Patient presents with  . New Patient (Initial Visit)    microscopic hematuria, x 4 yrs     HPI: 50 year old female who is referred by Dr. Aram Beecham, M.D. for further evaluation and management of microscopic hematuria. The patient states that for many years she has had trace red blood cells on her urine dips, but recently this has progressed some microscopic hematuria. She was recently seen and evaluated for her annual exam and noted to have 10-20 red blood cells per high-powered field on her UA. Her urine culture was negative. The patient states that she has had some progression of her urinary urgency and nocturia. She gets up proximal me 3 times per night which is a significant change from not getting up at all 6 months ago. In addition, the patient has severe urinary urgency and occasional incontinence. This is intermittent, and varies by day. The patient denies any stress incontinence. She states she has a strong stream, feels that she empties her bladder completely, and does not have to strain to void. She denies history of constipation. She is not sexually active. She denies any gross hematuria or dysuria. She has no history of recurrent urinary tract infections. She is no history of kidney stones. She does have a history of a radical hysterectomy. He's been seen and evaluated by urologist in the past with little recollection of her workup or chief complaint at the time. The patient does state that she has night sweats on a regular basis. She is also lost 40 pounds over the last 6 months unintentionally. Her energy level is relatively low but not significantly different from her baseline. She states also that she has inguinal lymphadenopathy intermittently which is sore. She has also noted slightly longer  bleeding times when she cuts herself. She denies any appreciable cervical or clavicular lymphadenopathy or any other lumps/bumps on her body.     PMH: Past Medical History  Diagnosis Date  . 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 Degeneration of intervertebral disc of cervical region 12/28/2015  . 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 Breast lump 02/26/2015  . Chest pain 10/28/2012  . Fatigue 02/26/2015  . Gonalgia 01/25/2016  . History of cervical dysplasia 02/21/2014    Last Assessment & Plan:  Patient s/p abdominal hysterectomy 10 years ago for AUB and persistent cervical dysplasia.  Reports abnormal pap smear of vagina approximately one year ago (first pap since hysterectomy) with subsequent colposcopy that was unable to be completed.  Repeat vaginal pap smear performed today.     . Night sweat 02/26/2015  . Obesity 10/28/2012  . 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  She declines medication intervention today but is interested in referral to psychiatry for counseling.  Referral made and contact information provided to patient.    . Tobacco use 10/28/2012    Last Assessment & Plan:  Discussed health risks associated with tobacco use.  Assessed readiness to quit.  Patient is not ready to try to quit at this time.  Offered support and guidance when she is ready to try.       Surgical History: Past Surgical History  Procedure Laterality Date  . Abdominal hysterectomy    . Cholecystectomy    .  Cervical discectomy    . Tonsillectomy      Home Medications:    Medication List       This list is accurate as of: 02/09/16  9:06 AM.  Always use your most recent med list.               doxycycline 100 MG tablet  Commonly known as:  VIBRA-TABS  Take 100 mg by mouth. Reported on 02/09/2016     HYDROcodone-acetaminophen 5-325 MG tablet  Commonly known as:  NORCO/VICODIN  Take by  mouth. Reported on 02/09/2016     ibuprofen 800 MG tablet  Commonly known as:  ADVIL,MOTRIN  Take 1 tablet (800 mg total) by mouth every 8 (eight) hours as needed for mild pain.     levofloxacin 500 MG tablet  Commonly known as:  LEVAQUIN  Reported on 02/09/2016     multivitamin tablet  Take 1 tablet by mouth daily.     oxyCODONE-acetaminophen 5-325 MG tablet  Commonly known as:  PERCOCET/ROXICET  Take 1-2 tablets by mouth every 4 (four) hours as needed.        Allergies:  Allergies  Allergen Reactions  . Ceftin Anaphylaxis  . Cefuroxime Anaphylaxis  . Cefuroxime Axetil Anaphylaxis  . Penicillin G Anaphylaxis  . Penicillins Anaphylaxis  . Bupropion Hives  . Buspar [Buspirone Hcl] Hives  . Buspirone Hives  . Wellbutrin [Bupropion Hcl] Hives    Family History: Family History  Problem Relation Age of Onset  . Diabetes Other     Social History:  reports that she has never smoked. She does not have any smokeless tobacco history on file. She reports that she drinks alcohol. She reports that she does not use illicit drugs.  ROS: UROLOGY Frequent Urination?: No Hard to postpone urination?: Yes Burning/pain with urination?: No Get up at night to urinate?: No Leakage of urine?: No Urine stream starts and stops?: No Trouble starting stream?: No Do you have to strain to urinate?: No Blood in urine?: No Urinary tract infection?: No Sexually transmitted disease?: No Injury to kidneys or bladder?: No Painful intercourse?: No Weak stream?: No Currently pregnant?: No Vaginal bleeding?: No Last menstrual period?: No  Gastrointestinal Nausea?: No Vomiting?: No Indigestion/heartburn?: No Diarrhea?: No Constipation?: No  Constitutional Fever: Yes Night sweats?: Yes Weight loss?: Yes Fatigue?: Yes  Skin Skin rash/lesions?: No Itching?: No  Eyes Blurred vision?: No Double vision?: No  Ears/Nose/Throat Sore throat?: No Sinus problems?:  No  Hematologic/Lymphatic Swollen glands?: No Easy bruising?: No  Cardiovascular Leg swelling?: No Chest pain?: No  Respiratory Cough?: No Shortness of breath?: No  Endocrine Excessive thirst?: No  Musculoskeletal Back pain?: Yes Joint pain?: No  Neurological Headaches?: No Dizziness?: No  Psychologic Depression?: No Anxiety?: No  Physical Exam: BP 129/82 mmHg  Pulse 94  Temp(Src) 99.2 F (37.3 C) (Oral)  Ht 5\' 6"  (1.676 m)  Wt 98.839 kg (217 lb 14.4 oz)  BMI 35.19 kg/m2  Constitutional:  Alert and oriented, No acute distress. HEENT: [place] AT, moist mucus membranes.  Trachea midline, no masses. Cardiovascular: No clubbing, cyanosis, or edema. Respiratory: Normal respiratory effort, no increased work of breathing. GI: Abdomen is soft, nontender, nondistended, no abdominal masses GU: No CVA tenderness.   Skin: No rashes, bruises or suspicious lesions. Lymph: No cervical or inguinal adenopathy. Neurologic: Grossly intact, no focal deficits, moving all 4 extremities. Psychiatric: Normal mood and affect.  Laboratory Data: Lab Results  Component Value Date   WBC 10.7* 01/10/2014  HGB 15.3* 01/10/2014   HCT 45.0 01/10/2014   MCV 90.5 01/10/2014   PLT 296 01/10/2014    Lab Results  Component Value Date   CREATININE 0.70 01/10/2014    No results found for: PSA  No results found for: TESTOSTERONE  No results found for: HGBA1C  Urinalysis    Component Value Date/Time   COLORURINE STRAW* 01/15/2011 0200   APPEARANCEUR CLEAR 01/15/2011 0200   LABSPEC 1.003* 01/15/2011 0200   PHURINE 6.5 01/15/2011 0200   GLUCOSEU NEGATIVE 01/15/2011 0200   HGBUR SMALL* 01/15/2011 0200   BILIRUBINUR NEGATIVE 01/15/2011 0200   KETONESUR NEGATIVE 01/15/2011 0200   PROTEINUR NEGATIVE 01/15/2011 0200   UROBILINOGEN 0.2 01/15/2011 0200   NITRITE NEGATIVE 01/15/2011 0200   LEUKOCYTESUR NEGATIVE 01/15/2011 0200    Pertinent Imaging: I have independently reviewed the  patient's renal ultrasound which is normal.  Assessment & Plan:  The patient has microscopic hematuria with associated progression of her urinary tract symptoms including worsening urinary urgency. Further, the patient has had unintentional weight loss of 40 pounds over the last 6 months and associated night sweats. While the renal ultrasound dated to straight no stones or clear etiology of any upper tract abnormality, we discussed obtaining a abdominal/pelvic CT scan to look for any additional causes of her hematuria or abnormality which may be concerning to her constitutional symptoms as well. The patient absolutely needs cystoscopy, however we are unable to accommodate her today given that all of our cystoscopes have been accounted for. We will plan to obtain a CT scan, hematuria protocol, within the very near future and have the patient follow-up for cystoscopy and  to review the results.  1. Microscopic hematuria - Urinalysis, Complete   No Follow-up on file.  Crist Fat, MD  [facility] [address] [place], [place] [address] [phone]

## 2016-02-24 ENCOUNTER — Ambulatory Visit (HOSPITAL_COMMUNITY)
Admission: RE | Admit: 2016-02-24 | Discharge: 2016-02-24 | Disposition: A | Discharge status: Home / Self Care | Payer: Managed Care, Other (non HMO) | Source: Ambulatory Visit | Attending: Urology | Admitting: Urology

## 2016-02-24 DIAGNOSIS — Z9049 Acquired absence of other specified parts of digestive tract: Secondary | ICD-10-CM | POA: Insufficient documentation

## 2016-02-24 DIAGNOSIS — R590 Localized enlarged lymph nodes: Secondary | ICD-10-CM | POA: Diagnosis not present

## 2016-02-24 DIAGNOSIS — R3129 Other microscopic hematuria: Secondary | ICD-10-CM | POA: Insufficient documentation

## 2016-02-24 DIAGNOSIS — K862 Cyst of pancreas: Secondary | ICD-10-CM | POA: Diagnosis not present

## 2016-02-24 MED ORDER — IOPAMIDOL (ISOVUE-300) INJECTION 61%
100.0000 mL | Freq: Once | INTRAVENOUS | Status: AC | PRN
  Administered 2016-02-24: 100 mL via INTRAVENOUS

## 2016-02-29 ENCOUNTER — Telehealth: Payer: Self-pay

## 2016-02-29 NOTE — Telephone Encounter (Signed)
Spoke with pt in reference to CT results. Pt voiced understanding stating she has already seen GI and will continue following up with them.

## 2016-02-29 NOTE — Telephone Encounter (Signed)
-----   Message from Crist FatBenjamin W Herrick, MD sent at 02/28/2016  9:07 PM EDT ----- Regarding: GI referral Hi Todrick Siedschlag, Can you please call this patient let her know that there is a ~3cm cyst that may be arising from pancreas which should be more closely evaluated by GI.  This doesn't look terribly suspicious but should be more completely evaluated.  Please make this referral as well. Thank you, Dr. Rexene EdisonH. ----- Message -----    From: Skeet Latchhelsea C Alayla Dethlefs, LPN    Sent: 1/61/09604/13/2017   9:44 AM      To: Crist FatBenjamin W Herrick, MD    ----- Message -----    From: Rad Results In Interface    Sent: 02/25/2016   8:32 AM      To: [facility]

## 2016-02-29 NOTE — Telephone Encounter (Signed)
LMOM

## 2016-03-03 ENCOUNTER — Encounter: Payer: Self-pay | Admitting: Urology

## 2016-03-03 ENCOUNTER — Ambulatory Visit (INDEPENDENT_AMBULATORY_CARE_PROVIDER_SITE_OTHER): Payer: Managed Care, Other (non HMO) | Admitting: Urology

## 2016-03-03 VITALS — BP 129/88 | HR 92 | Ht 66.0 in | Wt 221.1 lb

## 2016-03-03 DIAGNOSIS — R3129 Other microscopic hematuria: Secondary | ICD-10-CM

## 2016-03-03 LAB — URINALYSIS, COMPLETE
BILIRUBIN UA: NEGATIVE
GLUCOSE, UA: NEGATIVE
KETONES UA: NEGATIVE
LEUKOCYTES UA: NEGATIVE
Nitrite, UA: NEGATIVE
PROTEIN UA: NEGATIVE
SPEC GRAV UA: 1.015 (ref 1.005–1.030)
UUROB: 0.2 mg/dL (ref 0.2–1.0)
pH, UA: 5 (ref 5.0–7.5)

## 2016-03-03 LAB — MICROSCOPIC EXAMINATION: Bacteria, UA: NONE SEEN

## 2016-03-03 MED ORDER — DOXYCYCLINE MONOHYDRATE 100 MG PO CAPS: 100.0000 mg | ORAL_CAPSULE | Freq: Two times a day (BID) | ORAL | Status: DC

## 2016-03-03 MED ORDER — DOXYCYCLINE MONOHYDRATE 100 MG PO CAPS
100.0000 mg | ORAL_CAPSULE | Freq: Once | ORAL | Status: AC
  Administered 2016-03-03: 100 mg via ORAL

## 2016-03-03 MED ORDER — LIDOCAINE HCL 2 % EX GEL
1.0000 "application " | Freq: Once | CUTANEOUS | Status: AC
  Administered 2016-03-03: 1 via URETHRAL

## 2016-03-03 NOTE — Progress Notes (Signed)
   03/03/2016   CC: microscopic hematuria  HPI:  A 50 year old female with asymptomatic microscopic hematuria who returns today for follow-up of CT urogram results and office cystoscopy to complete her workup.    Patient, this is been present for many years and has progressed to more significant blood in the urine more recently. No history of gross hematuria.  Patient is a never smoker.  She does some baseline voiding symptoms including urinary frequency, urgency, and nocturia 3. No stress urinary incontinence.  CTU: CT urogram from 02/25/2016 personally reviewed.  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 She is are ready seen her gastroenterologist and [facility]. Her CA 125 is within normal limits.  Additionally, she had an indeterminate pelvic lymph node which was likely related to her recent lower abdominal wound infection which has since healed. Time of the scan, she was actively infected.  Medical history and medication history reviewed.   Cystoscopy Procedure Note  Patient identification was confirmed, informed consent was obtained, and patient was prepped using Betadine solution.  Lidocaine jelly was administered per urethral meatus.   External genitalia and normal meatus normal.  Preoperative abx where received prior to procedure.    Procedure: - Flexible cystoscope introduced, without any difficulty.   - Thorough search of the bladder revealed:    normal urethral meatus    normal urothelium    no stones    no ulcers     no tumors    no urethral polyps    no trabeculation  - Ureteral orifices were normal in position and appearance.  Post-Procedure: - Patient tolerated the procedure well  Retroflexion uremarkable   Post-Procedure: - Patient tolerated the procedure well  Assessment/plan: 1. Microscopic hematuria No GU pathology on CT scan which was reviewed personally. Cystoscopy  today also unremarkable. Findings were reviewed with the patient. No obvious etiology of microscopic hematuria. I have recommended follow-up in approximately 3-5 years if her microscopic heme persists for possible follow-up renal ultrasound/cystoscopy. Patient is agreeable with this plan  Patient has arranged for the appropriate follow up of a possible pancreatic lesion. No further workup indicated for inguinal lymph node node as there is a clear etiology for this.  Vanna ScotlandAshley Ree Alcalde, MD

## 2016-03-15 DIAGNOSIS — IMO0002 Reserved for concepts with insufficient information to code with codable children: Secondary | ICD-10-CM | POA: Insufficient documentation

## 2016-11-14 DIAGNOSIS — E063 Autoimmune thyroiditis: Secondary | ICD-10-CM | POA: Insufficient documentation

## 2016-11-14 DIAGNOSIS — E038 Other specified hypothyroidism: Secondary | ICD-10-CM | POA: Insufficient documentation

## 2017-06-05 ENCOUNTER — Emergency Department: Payer: 59

## 2017-06-05 ENCOUNTER — Emergency Department
Admission: EM | Admit: 2017-06-05 | Discharge: 2017-06-05 | Disposition: A | Discharge status: Home / Self Care | Payer: 59 | Attending: Emergency Medicine | Admitting: Emergency Medicine

## 2017-06-05 DIAGNOSIS — Y999 Unspecified external cause status: Secondary | ICD-10-CM | POA: Insufficient documentation

## 2017-06-05 DIAGNOSIS — Y929 Unspecified place or not applicable: Secondary | ICD-10-CM | POA: Insufficient documentation

## 2017-06-05 DIAGNOSIS — Y939 Activity, unspecified: Secondary | ICD-10-CM | POA: Diagnosis not present

## 2017-06-05 DIAGNOSIS — X500XXA Overexertion from strenuous movement or load, initial encounter: Secondary | ICD-10-CM | POA: Insufficient documentation

## 2017-06-05 DIAGNOSIS — Z79899 Other long term (current) drug therapy: Secondary | ICD-10-CM | POA: Diagnosis not present

## 2017-06-05 DIAGNOSIS — S39012A Strain of muscle, fascia and tendon of lower back, initial encounter: Secondary | ICD-10-CM | POA: Insufficient documentation

## 2017-06-05 DIAGNOSIS — S3992XA Unspecified injury of lower back, initial encounter: Secondary | ICD-10-CM | POA: Diagnosis present

## 2017-06-05 MED ORDER — MELOXICAM 15 MG PO TABS: 15.0000 mg | ORAL_TABLET | Freq: Every day | ORAL | 0 refills | Status: DC

## 2017-06-05 MED ORDER — METHOCARBAMOL 500 MG PO TABS: 500.0000 mg | ORAL_TABLET | Freq: Four times a day (QID) | ORAL | 0 refills | Status: DC

## 2017-06-05 MED ORDER — METHOCARBAMOL 500 MG PO TABS
1000.0000 mg | ORAL_TABLET | Freq: Once | ORAL | Status: AC
  Administered 2017-06-05: 1000 mg via ORAL
  Filled 2017-06-05: qty 2

## 2017-06-05 MED ORDER — MELOXICAM 7.5 MG PO TABS
15.0000 mg | ORAL_TABLET | Freq: Once | ORAL | Status: AC
  Administered 2017-06-05: 15 mg via ORAL
  Filled 2017-06-05: qty 2

## 2017-06-05 NOTE — ED Triage Notes (Signed)
Pt c/o lower back pain that occurred after moving furniture yesterday. Lower right sided back pain. Pt alert and oriented X4, active, cooperative, pt in NAD. RR even and unlabored, color WNL.  Tylenol and ibuprofen taken PTA with no relief.

## 2017-06-05 NOTE — ED Notes (Signed)
See triage note  States she developed pain to lower back . States she moved some furniture yesterday and developed pain   Ambulates well to room

## 2017-06-05 NOTE — ED Provider Notes (Signed)
[facility] Emergency Department Provider Note  ____________________________________________  Time seen: Approximately 7:11 PM  I have reviewed the triage vital signs and the nursing notes.   HISTORY  Chief Complaint Back Pain    HPI Jana HakimMary E Vandevoort is a 51 y.o. female who presents to emergency room complaining of right lower back pain. Patient reports that she was moving furniture all weekend and felt a sharp pain to her right lower back. Patient reports that she believes she has muscle spasms to the right lower back. She's had intermittent tingling down her right leg but no numbness. No bowel or bladder dysfunction, Salwa seizure, paresthesias. No direct trauma to the lumbar spine. Patient denies any urinary symptoms. Patient is tried ibuprofen and Tylenol at home as well as heating pad and ice packs. No other complaints at this time.   Past Medical History:  Diagnosis Date  . 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 Breast lump 02/26/2015  . Chest pain 10/28/2012  . Degeneration of intervertebral disc of cervical region 12/28/2015  . Fatigue 02/26/2015  . 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 Gonalgia 01/25/2016  . 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  Reports abnormal pap smear of vagina approximately one year ago (first pap since hysterectomy) with subsequent colposcopy that was unable to be completed.  Repeat vaginal pap smear performed today.     . Night sweat 02/26/2015  . Obesity 10/28/2012  . 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  She declines medication intervention today but is interested in referral to psychiatry for counseling.  Referral made and contact information provided to patient.    . Tobacco use 10/28/2012   Last  Assessment & Plan:  Discussed health risks associated with tobacco use.  Assessed readiness to quit.  Patient is not ready to try to quit at this time.  Offered support and guidance when she is ready to try.       Patient Active Problem List   Diagnosis Date Noted  . Gonalgia 01/25/2016  . Degeneration of intervertebral disc of cervical region 12/28/2015  . 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 Breast lump 02/26/2015  . Fatigue 02/26/2015  . 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 Night sweat 02/26/2015  . History of cervical dysplasia 02/21/2014  . 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 Chest pain 10/28/2012  . Tobacco use 10/28/2012  . Obesity 10/28/2012    Past Surgical History:  Procedure Laterality Date  . ABDOMINAL HYSTERECTOMY    . CERVICAL DISCECTOMY    . CHOLECYSTECTOMY    . TONSILLECTOMY      Prior to Admission medications   Medication Sig Start Date End Date Taking? Authorizing Provider  meloxicam (MOBIC) 15 MG tablet Take 1 tablet (15 mg total) by mouth daily. 06/05/17   Cuthriell, Delorise RoyalsJonathan D, PA-C  methocarbamol (ROBAXIN) 500 MG tablet Take 1 tablet (500 mg total) by mouth 4 (four) times daily. 06/05/17   Cuthriell, Delorise RoyalsJonathan D, PA-C  Multiple Vitamin (MULTIVITAMIN) tablet Take 1 tablet by mouth daily.    [provider]    Allergies Ceftin; Cefuroxime; Cefuroxime axetil; Penicillin g; Penicillins; Bupropion; Buspar [buspirone hcl]; Buspirone; and Wellbutrin [bupropion hcl]  Family History  Problem Relation Age of Onset  . Diabetes Other     Social History Social History  Substance Use Topics  .  Smoking status: Never Smoker  . Smokeless tobacco: Not on file  . Alcohol use 0.0 oz/week     Comment: occasional      Review of Systems  Constitutional: No fever/chills Eyes: No visual changes. No discharge ENT: No upper respiratory complaints. Cardiovascular: no chest pain. Respiratory: no cough. No SOB. Gastrointestinal: No abdominal pain.  No  nausea, no vomiting.  No diarrhea.  No constipation. Genitourinary: Negative for dysuria. No hematuria Musculoskeletal: Positive for right-sided lower back pain Skin: Negative for rash, abrasions, lacerations, ecchymosis. Neurological: Negative for headaches, focal weakness or numbness. 10-point ROS otherwise negative.  ____________________________________________   PHYSICAL EXAM:  VITAL SIGNS: ED Triage Vitals [06/05/17 1826]  Enc Vitals Group     BP (!) 135/55     Pulse Rate (!) 105     Resp 18     Temp 98.5 F (36.9 C)     Temp Source Oral     SpO2 96 %     Weight 250 lb (113.4 kg)     Height 5\' 7"  (1.702 m)     Head Circumference      Peak Flow      Pain Score 8     Pain Loc      Pain Edu?      Excl. in GC?      Constitutional: Alert and oriented. Well appearing and in no acute distress. Eyes: Conjunctivae are normal. PERRL. EOMI. Head: Atraumatic. Neck: No stridor.    Cardiovascular: Normal rate, regular rhythm. Normal S1 and S2.  Good peripheral circulation. Respiratory: Normal respiratory effort without tachypnea or retractions. Lungs CTAB. Good air entry to the bases with no decreased or absent breath sounds. Gastrointestinal: Bowel sounds 4 quadrants. Soft and nontender to palpation. No guarding or rigidity. No palpable masses. No distention. No CVA tenderness. Musculoskeletal: Full range of motion to all extremities. No gross deformities appreciated. No gross deformities spine upon inspection. Patient is diffusely tender to palpation midline and right-sided paraspinal muscle groups in the lumbar spine region. No palpable abnormality. No specific point tenderness. No tenderness to palpation over bilateral sciatic notches. Negative straight leg raise. Dorsalis pedis pulse intact bilateral lower shin knees. Sensation intact and equal bilateral lower extremity. Neurologic:  Normal speech and language. No gross focal neurologic deficits are appreciated.  Skin:  Skin is  warm, dry and intact. No rash noted. Psychiatric: Mood and affect are normal. Speech and behavior are normal. Patient exhibits appropriate insight and judgement.   ____________________________________________   LABS (all labs ordered are listed, but only abnormal results are displayed)  Labs Reviewed - No data to display ____________________________________________  EKG   ____________________________________________  RADIOLOGY Festus Barren Cuthriell, personally viewed and evaluated these images (plain radiographs) as part of my medical decision making, as well as reviewing the written report by the radiologist.  Dg Lumbar Spine Complete  Result Date: 06/05/2017 CLINICAL DATA:  52 year old female with lower right back pain started after moving furniture yesterday. Initial encounter. EXAM: LUMBAR SPINE - COMPLETE 4+ VIEW COMPARISON:  02/24/2016 CT. FINDINGS: Straightening of the lumbar spine. Mild L5-S1 disc space narrowing. No pars defect. No compression fracture. Vascular calcifications. IMPRESSION: Mild L5-S1 disc space narrowing. Mild straightening lumbar spine. Aortic Atherosclerosis (ICD10-I70.0). Electronically Signed   By: Lacy Duverney M.D.   On: 06/05/2017 19:49    ____________________________________________    PROCEDURES  Procedure(s) performed:    Procedures    Medications  meloxicam (MOBIC) tablet 15 mg (not administered)  methocarbamol (ROBAXIN)  tablet 1,000 mg (not administered)     ____________________________________________   INITIAL IMPRESSION / ASSESSMENT AND PLAN / ED COURSE  Pertinent labs & imaging results that were available during my care of the patient were reviewed by me and considered in my medical decision making (see chart for details).  Review of the [place] CSRS was performed in accordance of the [facility] prior to dispensing any controlled drugs.     Patient's diagnosis is consistent with Lumbar strain. X-ray reveals no acute osseous  abnormality. Exam is reassuring. No indication for further workup. He was given oral anti-inflammatory and oral muscle relaxer emergency department.. Patient will be discharged home with prescriptions for meloxicam and Robaxin. Patient is to follow up with primary care as needed or otherwise directed. Patient is given ED precautions to return to the ED for any worsening or new symptoms.     ____________________________________________  FINAL CLINICAL IMPRESSION(S) / ED DIAGNOSES  Final diagnoses:  Strain of lumbar region, initial encounter      NEW MEDICATIONS STARTED DURING THIS VISIT:  New Prescriptions   MELOXICAM (MOBIC) 15 MG TABLET    Take 1 tablet (15 mg total) by mouth daily.   METHOCARBAMOL (ROBAXIN) 500 MG TABLET    Take 1 tablet (500 mg total) by mouth 4 (four) times daily.        This chart was dictated using voice recognition software/Dragon. Despite best efforts to proofread, errors can occur which can change the meaning. Any change was purely unintentional.     Lanette Hampshire 06/05/17 2012    Phineas Semen, MD 06/05/17 2032

## 2017-09-07 DIAGNOSIS — K2100 Gastro-esophageal reflux disease with esophagitis, without bleeding: Secondary | ICD-10-CM | POA: Insufficient documentation

## 2017-09-07 DIAGNOSIS — K2970 Gastritis, unspecified, without bleeding: Secondary | ICD-10-CM | POA: Insufficient documentation

## 2017-09-11 ENCOUNTER — Other Ambulatory Visit: Payer: Self-pay | Admitting: Gastroenterology

## 2017-09-11 ENCOUNTER — Other Ambulatory Visit: Payer: Self-pay | Admitting: Nurse Practitioner

## 2017-09-11 DIAGNOSIS — R748 Abnormal levels of other serum enzymes: Secondary | ICD-10-CM

## 2017-09-11 DIAGNOSIS — Z1231 Encounter for screening mammogram for malignant neoplasm of breast: Secondary | ICD-10-CM

## 2017-09-11 DIAGNOSIS — R1084 Generalized abdominal pain: Secondary | ICD-10-CM

## 2017-09-14 DIAGNOSIS — E119 Type 2 diabetes mellitus without complications: Secondary | ICD-10-CM

## 2017-09-14 HISTORY — DX: Type 2 diabetes mellitus without complications: E11.9

## 2017-09-19 ENCOUNTER — Ambulatory Visit (HOSPITAL_COMMUNITY): Payer: 59

## 2017-09-19 ENCOUNTER — Ambulatory Visit: Payer: 59

## 2017-09-19 ENCOUNTER — Ambulatory Visit
Admission: RE | Admit: 2017-09-19 | Discharge: 2017-09-19 | Disposition: A | Discharge status: Home / Self Care | Payer: 59 | Source: Ambulatory Visit | Attending: Gastroenterology | Admitting: Gastroenterology

## 2017-09-19 ENCOUNTER — Other Ambulatory Visit: Payer: Self-pay | Admitting: Gastroenterology

## 2017-09-19 DIAGNOSIS — R1084 Generalized abdominal pain: Secondary | ICD-10-CM | POA: Insufficient documentation

## 2017-09-19 DIAGNOSIS — I700 Atherosclerosis of aorta: Secondary | ICD-10-CM | POA: Insufficient documentation

## 2017-09-19 DIAGNOSIS — R748 Abnormal levels of other serum enzymes: Secondary | ICD-10-CM | POA: Insufficient documentation

## 2017-09-19 MED ORDER — IOPAMIDOL (ISOVUE-370) INJECTION 76%
150.0000 mL | Freq: Once | INTRAVENOUS | Status: AC | PRN
  Administered 2017-09-19: 125 mL via INTRAVENOUS

## 2017-09-27 ENCOUNTER — Other Ambulatory Visit: Payer: Self-pay | Admitting: Nurse Practitioner

## 2017-09-27 ENCOUNTER — Ambulatory Visit
Admission: RE | Admit: 2017-09-27 | Discharge: 2017-09-27 | Disposition: A | Discharge status: Home / Self Care | Payer: 59 | Source: Ambulatory Visit | Attending: Nurse Practitioner | Admitting: Nurse Practitioner

## 2017-09-27 DIAGNOSIS — Z1231 Encounter for screening mammogram for malignant neoplasm of breast: Secondary | ICD-10-CM | POA: Insufficient documentation

## 2017-09-29 ENCOUNTER — Other Ambulatory Visit: Payer: Self-pay | Admitting: *Deleted

## 2017-09-29 ENCOUNTER — Inpatient Hospital Stay
Admission: RE | Admit: 2017-09-29 | Discharge: 2017-09-29 | Disposition: A | Discharge status: Home / Self Care | Payer: Self-pay | Source: Ambulatory Visit | Attending: *Deleted | Admitting: *Deleted

## 2017-09-29 DIAGNOSIS — Z9289 Personal history of other medical treatment: Secondary | ICD-10-CM

## 2017-11-15 DIAGNOSIS — G5603 Carpal tunnel syndrome, bilateral upper limbs: Secondary | ICD-10-CM | POA: Insufficient documentation

## 2017-12-13 ENCOUNTER — Other Ambulatory Visit: Payer: Self-pay

## 2017-12-20 ENCOUNTER — Encounter
Admission: RE | Disposition: A | Discharge status: Home / Self Care | Payer: Self-pay | Source: Ambulatory Visit | Attending: Surgery

## 2017-12-20 ENCOUNTER — Ambulatory Visit: Payer: 59 | Admitting: Anesthesiology

## 2017-12-20 ENCOUNTER — Ambulatory Visit
Admission: RE | Admit: 2017-12-20 | Discharge: 2017-12-20 | Disposition: A | Discharge status: Home / Self Care | Payer: 59 | Source: Ambulatory Visit | Attending: Surgery | Admitting: Surgery

## 2017-12-20 DIAGNOSIS — G5601 Carpal tunnel syndrome, right upper limb: Secondary | ICD-10-CM | POA: Insufficient documentation

## 2017-12-20 DIAGNOSIS — Z8249 Family history of ischemic heart disease and other diseases of the circulatory system: Secondary | ICD-10-CM | POA: Insufficient documentation

## 2017-12-20 DIAGNOSIS — E039 Hypothyroidism, unspecified: Secondary | ICD-10-CM | POA: Insufficient documentation

## 2017-12-20 DIAGNOSIS — F17210 Nicotine dependence, cigarettes, uncomplicated: Secondary | ICD-10-CM | POA: Diagnosis not present

## 2017-12-20 DIAGNOSIS — Z880 Allergy status to penicillin: Secondary | ICD-10-CM | POA: Diagnosis not present

## 2017-12-20 DIAGNOSIS — Z886 Allergy status to analgesic agent status: Secondary | ICD-10-CM | POA: Insufficient documentation

## 2017-12-20 DIAGNOSIS — Z8719 Personal history of other diseases of the digestive system: Secondary | ICD-10-CM | POA: Insufficient documentation

## 2017-12-20 DIAGNOSIS — Z6841 Body Mass Index (BMI) 40.0 and over, adult: Secondary | ICD-10-CM | POA: Insufficient documentation

## 2017-12-20 DIAGNOSIS — Z888 Allergy status to other drugs, medicaments and biological substances status: Secondary | ICD-10-CM | POA: Diagnosis not present

## 2017-12-20 DIAGNOSIS — K219 Gastro-esophageal reflux disease without esophagitis: Secondary | ICD-10-CM | POA: Diagnosis not present

## 2017-12-20 HISTORY — DX: Prediabetes: R73.03

## 2017-12-20 HISTORY — DX: Diverticulosis of intestine, part unspecified, without perforation or abscess without bleeding: K57.90

## 2017-12-20 HISTORY — DX: Gastro-esophageal reflux disease without esophagitis: K21.9

## 2017-12-20 HISTORY — PX: CARPAL TUNNEL RELEASE: SHX101

## 2017-12-20 HISTORY — DX: Carpal tunnel syndrome, unspecified upper limb: G56.00

## 2017-12-20 HISTORY — DX: Hypothyroidism, unspecified: E03.9

## 2017-12-20 HISTORY — DX: Sleep apnea, unspecified: G47.30

## 2017-12-20 SURGERY — RELEASE, CARPAL TUNNEL, ENDOSCOPIC
Anesthesia: General | Laterality: Right | Wound class: Clean

## 2017-12-20 MED ORDER — BUPIVACAINE HCL (PF) 0.5 % IJ SOLN
INTRAMUSCULAR | Status: DC | PRN
  Administered 2017-12-20: 10 mL

## 2017-12-20 MED ORDER — MEPERIDINE HCL 25 MG/ML IJ SOLN: 6.2500 mg | INTRAMUSCULAR | Status: DC | PRN

## 2017-12-20 MED ORDER — METOCLOPRAMIDE HCL 5 MG/ML IJ SOLN: 5.0000 mg | Freq: Three times a day (TID) | INTRAMUSCULAR | Status: DC | PRN

## 2017-12-20 MED ORDER — LIDOCAINE HCL (PF) 0.5 % IJ SOLN
INTRAMUSCULAR | Status: DC | PRN
  Administered 2017-12-20: 30 mL via INTRAVENOUS

## 2017-12-20 MED ORDER — HYDROCODONE-ACETAMINOPHEN 5-325 MG PO TABS: 1.0000 | ORAL_TABLET | Freq: Four times a day (QID) | ORAL | 0 refills | Status: DC | PRN

## 2017-12-20 MED ORDER — PROPOFOL 10 MG/ML IV BOLUS
INTRAVENOUS | Status: DC | PRN
  Administered 2017-12-20 (×2): 20 mg via INTRAVENOUS

## 2017-12-20 MED ORDER — ONDANSETRON HCL 4 MG PO TABS: 4.0000 mg | ORAL_TABLET | Freq: Four times a day (QID) | ORAL | Status: DC | PRN

## 2017-12-20 MED ORDER — OXYCODONE HCL 5 MG/5ML PO SOLN: 5.0000 mg | Freq: Once | ORAL | Status: DC | PRN

## 2017-12-20 MED ORDER — POTASSIUM CHLORIDE IN NACL 20-0.9 MEQ/L-% IV SOLN: INTRAVENOUS | Status: DC

## 2017-12-20 MED ORDER — PROPOFOL 500 MG/50ML IV EMUL
INTRAVENOUS | Status: DC | PRN
  Administered 2017-12-20: 100 ug/kg/min via INTRAVENOUS

## 2017-12-20 MED ORDER — FENTANYL CITRATE (PF) 100 MCG/2ML IJ SOLN: 25.0000 ug | INTRAMUSCULAR | Status: DC | PRN

## 2017-12-20 MED ORDER — HYDROCODONE-ACETAMINOPHEN 5-325 MG PO TABS: 1.0000 | ORAL_TABLET | ORAL | Status: DC | PRN

## 2017-12-20 MED ORDER — MIDAZOLAM HCL 2 MG/2ML IJ SOLN
INTRAMUSCULAR | Status: DC | PRN
  Administered 2017-12-20: 2 mg via INTRAVENOUS

## 2017-12-20 MED ORDER — DEXTROSE 5 % IV SOLN
900.0000 mg | Freq: Once | INTRAVENOUS | Status: AC
  Administered 2017-12-20: 900 mg via INTRAVENOUS

## 2017-12-20 MED ORDER — DEXMEDETOMIDINE HCL 200 MCG/2ML IV SOLN
INTRAVENOUS | Status: DC | PRN
  Administered 2017-12-20: 8 ug via INTRAVENOUS
  Administered 2017-12-20 (×2): 4 ug via INTRAVENOUS

## 2017-12-20 MED ORDER — LACTATED RINGERS IV SOLN
INTRAVENOUS | Status: DC
  Administered 2017-12-20: 13:00:00 via INTRAVENOUS

## 2017-12-20 MED ORDER — PROMETHAZINE HCL 25 MG/ML IJ SOLN: 6.2500 mg | INTRAMUSCULAR | Status: DC | PRN

## 2017-12-20 MED ORDER — METOCLOPRAMIDE HCL 5 MG PO TABS: 5.0000 mg | ORAL_TABLET | Freq: Three times a day (TID) | ORAL | Status: DC | PRN

## 2017-12-20 MED ORDER — ONDANSETRON HCL 4 MG/2ML IJ SOLN: 4.0000 mg | Freq: Four times a day (QID) | INTRAMUSCULAR | Status: DC | PRN

## 2017-12-20 MED ORDER — OXYCODONE HCL 5 MG PO TABS: 5.0000 mg | ORAL_TABLET | Freq: Once | ORAL | Status: DC | PRN

## 2017-12-20 SURGICAL SUPPLY — 28 items
BANDAGE ELASTIC 2 LF NS (GAUZE/BANDAGES/DRESSINGS) ×3 IMPLANT
BNDG CMPR MED 5X2 ELC HKLP NS (GAUZE/BANDAGES/DRESSINGS) ×1
BNDG COHESIVE 4X5 TAN STRL (GAUZE/BANDAGES/DRESSINGS) ×3 IMPLANT
BNDG ESMARK 4X12 TAN STRL LF (GAUZE/BANDAGES/DRESSINGS) ×3 IMPLANT
CHLORAPREP W/TINT 26ML (MISCELLANEOUS) ×3 IMPLANT
CORD BIP STRL DISP 12FT (MISCELLANEOUS) ×3 IMPLANT
COVER LIGHT HANDLE UNIVERSAL (MISCELLANEOUS) ×6 IMPLANT
CUFF TOURNIQUET DUAL PORT 18X3 (MISCELLANEOUS) ×3 IMPLANT
DRAPE SURG 17X11 SM STRL (DRAPES) ×3 IMPLANT
GAUZE PETRO XEROFOAM 1X8 (MISCELLANEOUS) ×3 IMPLANT
GAUZE SPONGE 4X4 12PLY STRL (GAUZE/BANDAGES/DRESSINGS) ×3 IMPLANT
GLOVE BIO SURGEON STRL SZ8 (GLOVE) ×3 IMPLANT
GLOVE INDICATOR 8.0 STRL GRN (GLOVE) ×3 IMPLANT
GOWN STRL REUS W/ TWL LRG LVL3 (GOWN DISPOSABLE) ×1 IMPLANT
GOWN STRL REUS W/ TWL XL LVL3 (GOWN DISPOSABLE) ×1 IMPLANT
GOWN STRL REUS W/TWL LRG LVL3 (GOWN DISPOSABLE) ×3
GOWN STRL REUS W/TWL XL LVL3 (GOWN DISPOSABLE) ×3
KIT CARPAL TUNNEL (MISCELLANEOUS) ×3
KIT ESCP INSRT D SLOT CANN KN (MISCELLANEOUS) ×1 IMPLANT
KIT RM TURNOVER STRD PROC AR (KITS) ×3 IMPLANT
NS IRRIG 500ML POUR BTL (IV SOLUTION) ×3 IMPLANT
PACK EXTREMITY ARMC (MISCELLANEOUS) ×3 IMPLANT
SPLINT WRIST LG RT TX900304 (SOFTGOODS) ×2 IMPLANT
STOCKINETTE IMPERVIOUS 9X36 MD (GAUZE/BANDAGES/DRESSINGS) ×3 IMPLANT
STRAP BODY AND KNEE 60X3 (MISCELLANEOUS) ×3 IMPLANT
SUT PROLENE 4 0 PS 2 18 (SUTURE) ×3 IMPLANT
SUT VIC AB 3-0 SH 27 (SUTURE)
SUT VIC AB 3-0 SH 27X BRD (SUTURE) IMPLANT

## 2017-12-20 NOTE — Addendum Note (Signed)
Addendum  created 12/20/17 1456 by Maryan RuedWilson, Roshun Klingensmith M, CRNA   Intraprocedure Meds edited

## 2017-12-20 NOTE — Transfer of Care (Signed)
Immediate Anesthesia Transfer of Care Note  Patient: Melanie Oneal  Procedure(s) Performed: CARPAL TUNNEL RELEASE ENDOSCOPIC (Right )  Patient Location: PACU  Anesthesia Type: General  Level of Consciousness: awake, alert  and patient cooperative  Airway and Oxygen Therapy: Patient Spontanous Breathing and Patient connected to supplemental oxygen  Post-op Assessment: Post-op Vital signs reviewed, Patient's Cardiovascular Status Stable, Respiratory Function Stable, Patent Airway and No signs of Nausea or vomiting  Post-op Vital Signs: Reviewed and stable  Complications: No apparent anesthesia complications

## 2017-12-20 NOTE — Anesthesia Preprocedure Evaluation (Addendum)
Anesthesia Evaluation  Patient identified by MRN, date of birth, ID band Patient awake    Reviewed: Allergy & Precautions, H&P , NPO status , Patient's Chart, lab work & pertinent test results, reviewed documented beta blocker date and time   Airway Mallampati: II  TM Distance: >3 FB Neck ROM: full    Dental no notable dental hx.    Pulmonary sleep apnea (probable) , Current Smoker,    Pulmonary exam normal breath sounds clear to auscultation       Cardiovascular Exercise Tolerance: Good negative cardio ROS   Rhythm:regular Rate:Normal     Neuro/Psych PSYCHIATRIC DISORDERS negative neurological ROS     GI/Hepatic Neg liver ROS, GERD  ,  Endo/Other  Hypothyroidism Morbid obesity  Renal/GU negative Renal ROS  negative genitourinary   Musculoskeletal   Abdominal   Peds  Hematology negative hematology ROS (+)   Anesthesia Other Findings   Reproductive/Obstetrics negative OB ROS                            Anesthesia Physical Anesthesia Plan  ASA: II  Anesthesia Plan: General   Post-op Pain Management:    Induction:   PONV Risk Score and Plan:   Airway Management Planned:   Additional Equipment:   Intra-op Plan:   Post-operative Plan:   Informed Consent: I have reviewed the patients History and Physical, chart, labs and discussed the procedure including the risks, benefits and alternatives for the proposed anesthesia with the patient or authorized representative who has indicated his/her understanding and acceptance.   Dental Advisory Given  Plan Discussed with: CRNA  Anesthesia Plan Comments:         Anesthesia Quick Evaluation

## 2017-12-20 NOTE — H&P (Signed)
Paper H&P to be scanned into permanent record. H&P reviewed and patient re-examined. No changes. 

## 2017-12-20 NOTE — Op Note (Signed)
12/20/2017  2:41 PM  Patient:   Melanie HakimMary E Zurn  Pre-Op Diagnosis:   Right carpal tunnel syndrome.  Post-Op Diagnosis:   Same.  Procedure:   Endoscopic right carpal tunnel release.  Surgeon:   Maryagnes AmosJ. Jeffrey Poggi, MD  Anesthesia:   Bier block  Findings:   As above.  Complications:   None  EBL:   2 cc  Fluids:   200 cc crystalloid  TT:   21 minutes at 250 mmHg  Drains:   None  Closure:   4-0 Prolene interrupted sutures  Brief Clinical Note:   The patient is a 52 year old female with a long history of bilateral hand and wrist pain and paresthesias, right more symptomatic than left. Her symptoms have persisted despite medications, activity modification, splinting, etc. Her history and examination are consistent with bilateral carpal tunnel syndrome, confirmed by EMG. The patient presents at this time for an endoscopic right carpal tunnel release.   Procedure:   The patient was brought into the operating room and lain in the supine position. After adequate IV sedation was achieved, a timeout was performed to verify the appropriate surgical site before a Bier block was placed by the anesthesiologist and the tourniquet inflated to 250 mmHg. The right hand and upper extremity were prepped with ChloraPrep solution before being draped sterilely. Preoperative antibiotics were administered. An approximately 1.5-2 cm incision was made over the volar wrist flexion crease, centered over the palmaris longus tendon. The incision was carried down through the subcutaneous tissues with care taken to identify and protect any neurovascular structures. The distal forearm fascia was penetrated just proximal to the transverse carpal ligament. The soft tissues were released off the superficial and deep surfaces of the distal forearm fascia and this was released proximally for 3-4 cm under direct visualization.  Attention was directed distally. The Therapist, nutritionalreer elevator was passed beneath the transverse carpal ligament  along the ulnar aspect of the carpal tunnel and used to release any adhesions as well as to remove any adherent synovial tissue before first the smaller then the larger of the two dilators were passed beneath the transverse carpal ligament along the ulnar margin of the carpal tunnel. The slotted cannula was introduced and the endoscope was placed into the slotted cannula and the undersurface of the transverse carpal ligament visualized. The distal margin of the transverse carpal ligament was marked by placing a 25-gauge needle percutaneously at Kaplan's cardinal point so that it entered the distal portion of the slotted cannula. Under endoscopic visualization, the transverse carpal ligament was released from proximal to distal using the end-cutting blade. A second pass was performed to ensure complete release of the ligament. The adequacy of release was verified both endoscopically and by palpation using the freer elevator.  The wound was irrigated thoroughly with sterile saline solution before being closed using 4-0 Prolene interrupted sutures. A total of 10 cc of 0.5% plain Sensorcaine was injected in and around the incision before a sterile bulky dressing was applied to the wound. The patient was placed into a volar wrist splint before being awakened and returned to the recovery room in satisfactory condition after tolerating the procedure well.

## 2017-12-20 NOTE — Discharge Instructions (Signed)
General Anesthesia, Adult, Care After °These instructions provide you with information about caring for yourself after your procedure. Your health care provider may also give you more specific instructions. Your treatment has been planned according to current medical practices, but problems sometimes occur. Call your health care provider if you have any problems or questions after your procedure. °What can I expect after the procedure? °After the procedure, it is common to have: °· Vomiting. °· A sore throat. °· Mental slowness. ° °It is common to feel: °· Nauseous. °· Cold or shivery. °· Sleepy. °· Tired. °· Sore or achy, even in parts of your body where you did not have surgery. ° °Follow these instructions at home: °For at least 24 hours after the procedure: °· Do not: °? Participate in activities where you could fall or become injured. °? Drive. °? Use heavy machinery. °? Drink alcohol. °? Take sleeping pills or medicines that cause drowsiness. °? Make important decisions or sign legal documents. °? Take care of children on your own. °· Rest. °Eating and drinking °· If you vomit, drink water, juice, or soup when you can drink without vomiting. °· Drink enough fluid to keep your urine clear or pale yellow. °· Make sure you have little or no nausea before eating solid foods. °· Follow the diet recommended by your health care provider. °General instructions °· Have a responsible adult stay with you until you are awake and alert. °· Return to your normal activities as told by your health care provider. Ask your health care provider what activities are safe for you. °· Take over-the-counter and prescription medicines only as told by your health care provider. °· If you smoke, do not smoke without supervision. °· Keep all follow-up visits as told by your health care provider. This is important. °Contact a health care provider if: °· You continue to have nausea or vomiting at home, and medicines are not helpful. °· You  cannot drink fluids or start eating again. °· You cannot urinate after 8-12 hours. °· You develop a skin rash. °· You have fever. °· You have increasing redness at the site of your procedure. °Get help right away if: °· You have difficulty breathing. °· You have chest pain. °· You have unexpected bleeding. °· You feel that you are having a life-threatening or urgent problem. °This information is not intended to replace advice given to you by your health care provider. Make sure you discuss any questions you have with your health care provider. °Document Released: 02/06/2001 Document Revised: 04/04/2016 Document Reviewed: 10/15/2015 °Elsevier Interactive Patient Education © 2018 [employer] ° ° °Keep dressing dry and intact. °Keep hand elevated above heart level. °May shower after dressing removed on postop day 4 (Sunday). °Cover sutures with Band-Aids after drying off. °Apply ice to affected area frequently. °Take ibuprofen 600 mg TID with meals for 7-10 days, then as necessary. °Take ES Tylenol or pain medication as prescribed when needed.  °Return for follow-up in 10-14 days or as scheduled. °

## 2017-12-20 NOTE — Anesthesia Postprocedure Evaluation (Signed)
Anesthesia Post Note  Patient: Melanie HakimMary E Oneal  Procedure(s) Performed: CARPAL TUNNEL RELEASE ENDOSCOPIC (Right )  Patient location during evaluation: PACU Anesthesia Type: General Level of consciousness: awake and alert Pain management: pain level controlled Vital Signs Assessment: post-procedure vital signs reviewed and stable Respiratory status: spontaneous breathing, nonlabored ventilation, respiratory function stable and patient connected to nasal cannula oxygen Cardiovascular status: blood pressure returned to baseline and stable Postop Assessment: no apparent nausea or vomiting Anesthetic complications: no    SCOURAS, NICOLE ELAINE

## 2017-12-20 NOTE — Anesthesia Procedure Notes (Signed)
Procedure Name: MAC Date/Time: 12/20/2017 2:06 PM Performed by: Janna Arch, CRNA Pre-anesthesia Checklist: Patient identified, Emergency Drugs available, Suction available and Patient being monitored Patient Re-evaluated:Patient Re-evaluated prior to induction Oxygen Delivery Method: Simple face mask

## 2017-12-21 ENCOUNTER — Encounter: Payer: Self-pay | Admitting: Surgery

## 2018-03-19 ENCOUNTER — Encounter
Admission: EM | Disposition: A | Discharge status: Home / Self Care | Payer: Self-pay | Source: Home / Self Care | Attending: Emergency Medicine

## 2018-03-19 ENCOUNTER — Encounter: Payer: Self-pay | Admitting: *Deleted

## 2018-03-19 ENCOUNTER — Observation Stay: Payer: 59 | Admitting: Certified Registered Nurse Anesthetist

## 2018-03-19 ENCOUNTER — Other Ambulatory Visit: Payer: Self-pay

## 2018-03-19 ENCOUNTER — Observation Stay
Admission: EM | Admit: 2018-03-19 | Discharge: 2018-03-20 | Disposition: A | Discharge status: Home / Self Care | Payer: 59 | Attending: Surgery | Admitting: Surgery

## 2018-03-19 DIAGNOSIS — Z881 Allergy status to other antibiotic agents status: Secondary | ICD-10-CM | POA: Diagnosis not present

## 2018-03-19 DIAGNOSIS — K648 Other hemorrhoids: Secondary | ICD-10-CM | POA: Diagnosis present

## 2018-03-19 DIAGNOSIS — E063 Autoimmune thyroiditis: Secondary | ICD-10-CM | POA: Insufficient documentation

## 2018-03-19 DIAGNOSIS — K5790 Diverticulosis of intestine, part unspecified, without perforation or abscess without bleeding: Secondary | ICD-10-CM | POA: Diagnosis not present

## 2018-03-19 DIAGNOSIS — Z90710 Acquired absence of both cervix and uterus: Secondary | ICD-10-CM | POA: Insufficient documentation

## 2018-03-19 DIAGNOSIS — K643 Fourth degree hemorrhoids: Secondary | ICD-10-CM | POA: Diagnosis not present

## 2018-03-19 DIAGNOSIS — R61 Generalized hyperhidrosis: Secondary | ICD-10-CM | POA: Insufficient documentation

## 2018-03-19 DIAGNOSIS — Z886 Allergy status to analgesic agent status: Secondary | ICD-10-CM | POA: Diagnosis not present

## 2018-03-19 DIAGNOSIS — Z801 Family history of malignant neoplasm of trachea, bronchus and lung: Secondary | ICD-10-CM | POA: Insufficient documentation

## 2018-03-19 DIAGNOSIS — Z888 Allergy status to other drugs, medicaments and biological substances status: Secondary | ICD-10-CM | POA: Diagnosis not present

## 2018-03-19 DIAGNOSIS — G4730 Sleep apnea, unspecified: Secondary | ICD-10-CM | POA: Diagnosis not present

## 2018-03-19 DIAGNOSIS — Z8249 Family history of ischemic heart disease and other diseases of the circulatory system: Secondary | ICD-10-CM | POA: Diagnosis not present

## 2018-03-19 DIAGNOSIS — Z833 Family history of diabetes mellitus: Secondary | ICD-10-CM | POA: Insufficient documentation

## 2018-03-19 DIAGNOSIS — Z9049 Acquired absence of other specified parts of digestive tract: Secondary | ICD-10-CM | POA: Insufficient documentation

## 2018-03-19 DIAGNOSIS — Z841 Family history of disorders of kidney and ureter: Secondary | ICD-10-CM | POA: Insufficient documentation

## 2018-03-19 DIAGNOSIS — Z6838 Body mass index (BMI) 38.0-38.9, adult: Secondary | ICD-10-CM | POA: Diagnosis not present

## 2018-03-19 DIAGNOSIS — Z880 Allergy status to penicillin: Secondary | ICD-10-CM | POA: Diagnosis not present

## 2018-03-19 DIAGNOSIS — Z79899 Other long term (current) drug therapy: Secondary | ICD-10-CM | POA: Diagnosis not present

## 2018-03-19 DIAGNOSIS — E669 Obesity, unspecified: Secondary | ICD-10-CM | POA: Diagnosis not present

## 2018-03-19 DIAGNOSIS — F329 Major depressive disorder, single episode, unspecified: Secondary | ICD-10-CM | POA: Diagnosis not present

## 2018-03-19 DIAGNOSIS — R7303 Prediabetes: Secondary | ICD-10-CM | POA: Diagnosis not present

## 2018-03-19 DIAGNOSIS — K219 Gastro-esophageal reflux disease without esophagitis: Secondary | ICD-10-CM | POA: Diagnosis not present

## 2018-03-19 DIAGNOSIS — F17210 Nicotine dependence, cigarettes, uncomplicated: Secondary | ICD-10-CM | POA: Diagnosis not present

## 2018-03-19 DIAGNOSIS — G709 Myoneural disorder, unspecified: Secondary | ICD-10-CM | POA: Insufficient documentation

## 2018-03-19 DIAGNOSIS — M5030 Other cervical disc degeneration, unspecified cervical region: Secondary | ICD-10-CM | POA: Insufficient documentation

## 2018-03-19 HISTORY — PX: EVALUATION UNDER ANESTHESIA WITH HEMORRHOIDECTOMY: SHX5624

## 2018-03-19 LAB — CBC
HCT: 44.2 % (ref 35.0–47.0)
HEMOGLOBIN: 14.9 g/dL (ref 12.0–16.0)
MCH: 29.1 pg (ref 26.0–34.0)
MCHC: 33.7 g/dL (ref 32.0–36.0)
MCV: 86.6 fL (ref 80.0–100.0)
PLATELETS: 314 10*3/uL (ref 150–440)
RBC: 5.1 MIL/uL (ref 3.80–5.20)
RDW: 15.1 % — ABNORMAL HIGH (ref 11.5–14.5)
WBC: 14.3 10*3/uL — AB (ref 3.6–11.0)

## 2018-03-19 LAB — SURGICAL PCR SCREEN
MRSA, PCR: NEGATIVE
Staphylococcus aureus: NEGATIVE

## 2018-03-19 LAB — COMPREHENSIVE METABOLIC PANEL
ALK PHOS: 125 U/L (ref 38–126)
ALT: 17 U/L (ref 14–54)
ANION GAP: 9 (ref 5–15)
AST: 21 U/L (ref 15–41)
Albumin: 4 g/dL (ref 3.5–5.0)
BUN: 19 mg/dL (ref 6–20)
CALCIUM: 8.8 mg/dL — AB (ref 8.9–10.3)
CO2: 25 mmol/L (ref 22–32)
CREATININE: 0.84 mg/dL (ref 0.44–1.00)
Chloride: 100 mmol/L — ABNORMAL LOW (ref 101–111)
Glucose, Bld: 104 mg/dL — ABNORMAL HIGH (ref 65–99)
Potassium: 4 mmol/L (ref 3.5–5.1)
Sodium: 134 mmol/L — ABNORMAL LOW (ref 135–145)
Total Bilirubin: 0.3 mg/dL (ref 0.3–1.2)
Total Protein: 7.7 g/dL (ref 6.5–8.1)

## 2018-03-19 SURGERY — EXAM UNDER ANESTHESIA WITH HEMORRHOIDECTOMY
Anesthesia: General | Wound class: Clean Contaminated

## 2018-03-19 MED ORDER — BUPIVACAINE-EPINEPHRINE 0.5% -1:200000 IJ SOLN
INTRAMUSCULAR | Status: DC | PRN
  Administered 2018-03-19: 30 mL

## 2018-03-19 MED ORDER — BUPIVACAINE LIPOSOME 1.3 % IJ SUSP
INTRAMUSCULAR | Status: AC
  Filled 2018-03-19: qty 20

## 2018-03-19 MED ORDER — LIDOCAINE HCL URETHRAL/MUCOSAL 2 % EX GEL
CUTANEOUS | Status: DC | PRN
  Administered 2018-03-19: 1

## 2018-03-19 MED ORDER — LACTATED RINGERS IV SOLN
INTRAVENOUS | Status: DC
  Administered 2018-03-19 (×2): via INTRAVENOUS

## 2018-03-19 MED ORDER — FENTANYL CITRATE (PF) 100 MCG/2ML IJ SOLN
INTRAMUSCULAR | Status: DC | PRN
  Administered 2018-03-19 (×4): 25 ug via INTRAVENOUS

## 2018-03-19 MED ORDER — ONDANSETRON HCL 4 MG/2ML IJ SOLN
INTRAMUSCULAR | Status: AC
  Filled 2018-03-19: qty 2

## 2018-03-19 MED ORDER — ONDANSETRON HCL 4 MG/2ML IJ SOLN: 4.0000 mg | Freq: Four times a day (QID) | INTRAMUSCULAR | Status: DC | PRN

## 2018-03-19 MED ORDER — ONDANSETRON 4 MG PO TBDP: 4.0000 mg | ORAL_TABLET | Freq: Four times a day (QID) | ORAL | Status: DC | PRN

## 2018-03-19 MED ORDER — POLYETHYLENE GLYCOL 3350 17 G PO PACK
17.0000 g | PACK | Freq: Every day | ORAL | Status: DC
  Administered 2018-03-20: 17 g via ORAL
  Filled 2018-03-19 (×2): qty 1

## 2018-03-19 MED ORDER — FENTANYL CITRATE (PF) 100 MCG/2ML IJ SOLN: 25.0000 ug | INTRAMUSCULAR | Status: DC | PRN

## 2018-03-19 MED ORDER — MIDAZOLAM HCL 2 MG/2ML IJ SOLN
INTRAMUSCULAR | Status: DC | PRN
  Administered 2018-03-19: 2 mg via INTRAVENOUS

## 2018-03-19 MED ORDER — HYDROMORPHONE HCL 1 MG/ML IJ SOLN
0.5000 mg | Freq: Once | INTRAMUSCULAR | Status: AC
  Administered 2018-03-19: 0.5 mg via INTRAVENOUS
  Filled 2018-03-19: qty 1

## 2018-03-19 MED ORDER — FENTANYL CITRATE (PF) 100 MCG/2ML IJ SOLN
INTRAMUSCULAR | Status: AC
  Filled 2018-03-19: qty 2

## 2018-03-19 MED ORDER — FAMOTIDINE IN NACL 20-0.9 MG/50ML-% IV SOLN
20.0000 mg | Freq: Two times a day (BID) | INTRAVENOUS | Status: DC
  Administered 2018-03-19 (×2): 20 mg via INTRAVENOUS
  Filled 2018-03-19 (×2): qty 50

## 2018-03-19 MED ORDER — ONDANSETRON HCL 4 MG/2ML IJ SOLN
INTRAMUSCULAR | Status: DC | PRN
  Administered 2018-03-19: 4 mg via INTRAVENOUS

## 2018-03-19 MED ORDER — BUPIVACAINE LIPOSOME 1.3 % IJ SUSP
INTRAMUSCULAR | Status: DC | PRN
  Administered 2018-03-19: 20 mL

## 2018-03-19 MED ORDER — FENTANYL CITRATE (PF) 100 MCG/2ML IJ SOLN
50.0000 ug | INTRAMUSCULAR | Status: AC | PRN
  Administered 2018-03-19 (×2): 50 ug via INTRAVENOUS
  Filled 2018-03-19 (×2): qty 2

## 2018-03-19 MED ORDER — HYDROMORPHONE HCL 1 MG/ML IJ SOLN: 0.5000 mg | INTRAMUSCULAR | Status: DC | PRN

## 2018-03-19 MED ORDER — MIDAZOLAM HCL 2 MG/2ML IJ SOLN
INTRAMUSCULAR | Status: AC
  Filled 2018-03-19: qty 2

## 2018-03-19 MED ORDER — DEXAMETHASONE SODIUM PHOSPHATE 10 MG/ML IJ SOLN
INTRAMUSCULAR | Status: DC | PRN
  Administered 2018-03-19: 10 mg via INTRAVENOUS

## 2018-03-19 MED ORDER — BUPIVACAINE-EPINEPHRINE (PF) 0.5% -1:200000 IJ SOLN
INTRAMUSCULAR | Status: AC
  Filled 2018-03-19: qty 30

## 2018-03-19 MED ORDER — LIDOCAINE HCL URETHRAL/MUCOSAL 2 % EX GEL
CUTANEOUS | Status: AC
  Filled 2018-03-19: qty 10

## 2018-03-19 MED ORDER — HEPARIN SODIUM (PORCINE) 5000 UNIT/ML IJ SOLN
5000.0000 [IU] | Freq: Three times a day (TID) | INTRAMUSCULAR | Status: DC
  Administered 2018-03-19 (×3): 5000 [IU] via SUBCUTANEOUS
  Filled 2018-03-19 (×4): qty 1

## 2018-03-19 MED ORDER — KETOROLAC TROMETHAMINE 30 MG/ML IJ SOLN
30.0000 mg | Freq: Four times a day (QID) | INTRAMUSCULAR | Status: DC
  Administered 2018-03-19 – 2018-03-20 (×4): 30 mg via INTRAVENOUS
  Filled 2018-03-19 (×4): qty 1

## 2018-03-19 MED ORDER — LIDOCAINE HCL URETHRAL/MUCOSAL 2 % EX GEL
CUTANEOUS | Status: AC
  Administered 2018-03-19: 1 via TOPICAL
  Filled 2018-03-19: qty 10

## 2018-03-19 MED ORDER — PROPOFOL 10 MG/ML IV BOLUS
INTRAVENOUS | Status: DC | PRN
  Administered 2018-03-19: 160 mg via INTRAVENOUS

## 2018-03-19 MED ORDER — METRONIDAZOLE IN NACL 5-0.79 MG/ML-% IV SOLN
500.0000 mg | Freq: Three times a day (TID) | INTRAVENOUS | Status: DC
  Administered 2018-03-19 (×3): 500 mg via INTRAVENOUS
  Filled 2018-03-19 (×4): qty 100

## 2018-03-19 MED ORDER — SODIUM CHLORIDE 0.9 % IJ SOLN
INTRAMUSCULAR | Status: DC | PRN
  Administered 2018-03-19: 10 mL via INTRAVENOUS

## 2018-03-19 MED ORDER — CIPROFLOXACIN IN D5W 400 MG/200ML IV SOLN
400.0000 mg | Freq: Two times a day (BID) | INTRAVENOUS | Status: DC
  Administered 2018-03-19 (×2): 400 mg via INTRAVENOUS
  Filled 2018-03-19 (×3): qty 200

## 2018-03-19 MED ORDER — PROPOFOL 10 MG/ML IV BOLUS
INTRAVENOUS | Status: AC
  Filled 2018-03-19: qty 20

## 2018-03-19 MED ORDER — CHLORHEXIDINE GLUCONATE CLOTH 2 % EX PADS
6.0000 | MEDICATED_PAD | Freq: Every day | CUTANEOUS | Status: DC
  Administered 2018-03-19: 6 via TOPICAL

## 2018-03-19 MED ORDER — LEVOTHYROXINE SODIUM 112 MCG PO TABS
112.0000 ug | ORAL_TABLET | Freq: Every day | ORAL | Status: DC
  Administered 2018-03-20: 112 ug via ORAL
  Filled 2018-03-19: qty 1

## 2018-03-19 MED ORDER — LIDOCAINE HCL (CARDIAC) PF 100 MG/5ML IV SOSY
PREFILLED_SYRINGE | INTRAVENOUS | Status: DC | PRN
  Administered 2018-03-19: 100 mg via INTRAVENOUS

## 2018-03-19 MED ORDER — SODIUM CHLORIDE FLUSH 0.9 % IV SOLN
INTRAVENOUS | Status: AC
  Filled 2018-03-19: qty 10

## 2018-03-19 MED ORDER — LACTATED RINGERS IV SOLN
INTRAVENOUS | Status: DC
  Administered 2018-03-19 (×2): via INTRAVENOUS

## 2018-03-19 MED ORDER — SODIUM CHLORIDE 0.9 % IV BOLUS
1000.0000 mL | Freq: Once | INTRAVENOUS | Status: AC
  Administered 2018-03-19: 1000 mL via INTRAVENOUS

## 2018-03-19 MED ORDER — ONDANSETRON HCL 4 MG/2ML IJ SOLN: 4.0000 mg | Freq: Once | INTRAMUSCULAR | Status: DC | PRN

## 2018-03-19 MED ORDER — LIDOCAINE HCL (PF) 2 % IJ SOLN
INTRAMUSCULAR | Status: AC
  Filled 2018-03-19: qty 10

## 2018-03-19 MED ORDER — GELATIN ABSORBABLE 100 CM EX MISC
CUTANEOUS | Status: AC
  Filled 2018-03-19: qty 1

## 2018-03-19 MED ORDER — DEXAMETHASONE SODIUM PHOSPHATE 10 MG/ML IJ SOLN
INTRAMUSCULAR | Status: AC
  Filled 2018-03-19: qty 1

## 2018-03-19 MED ORDER — LIDOCAINE HCL URETHRAL/MUCOSAL 2 % EX GEL
1.0000 "application " | Freq: Once | CUTANEOUS | Status: AC
  Administered 2018-03-19: 1 via TOPICAL

## 2018-03-19 MED ORDER — OXYCODONE HCL 5 MG PO TABS: 5.0000 mg | ORAL_TABLET | ORAL | Status: DC | PRN

## 2018-03-19 MED ORDER — ACETAMINOPHEN 10 MG/ML IV SOLN
1000.0000 mg | Freq: Four times a day (QID) | INTRAVENOUS | Status: AC
  Administered 2018-03-19 – 2018-03-20 (×3): 1000 mg via INTRAVENOUS
  Filled 2018-03-19 (×4): qty 100

## 2018-03-19 MED ORDER — SURGIFOAM 100 EX MISC
CUTANEOUS | Status: DC | PRN
  Administered 2018-03-19: 1 via TOPICAL

## 2018-03-19 MED ORDER — MUPIROCIN 2 % EX OINT
1.0000 "application " | TOPICAL_OINTMENT | Freq: Two times a day (BID) | CUTANEOUS | Status: DC
  Administered 2018-03-19: 1 via NASAL
  Filled 2018-03-19: qty 22

## 2018-03-19 SURGICAL SUPPLY — 29 items
BRIEF STRETCH MATERNITY 2XLG (MISCELLANEOUS) ×3 IMPLANT
CANISTER SUCT 1200ML W/VALVE (MISCELLANEOUS) ×3 IMPLANT
DRAPE LAPAROTOMY 100X77 ABD (DRAPES) ×3 IMPLANT
DRAPE LEGGINS SURG 28X43 STRL (DRAPES) ×3 IMPLANT
DRAPE UNDER BUTTOCK W/FLU (DRAPES) ×3 IMPLANT
DRSG GAUZE FLUFF 36X18 (GAUZE/BANDAGES/DRESSINGS) ×3 IMPLANT
ELECT REM PT RETURN 9FT ADLT (ELECTROSURGICAL) ×3
ELECTRODE REM PT RTRN 9FT ADLT (ELECTROSURGICAL) ×1 IMPLANT
GLOVE INDICATOR 7.0 STRL GRN (GLOVE) ×2 IMPLANT
GLOVE SURG SYN 7.0 (GLOVE) ×6 IMPLANT
GLOVE SURG SYN 7.5  E (GLOVE) ×4
GLOVE SURG SYN 7.5 E (GLOVE) ×2 IMPLANT
GOWN STRL REUS W/ TWL LRG LVL3 (GOWN DISPOSABLE) ×2 IMPLANT
GOWN STRL REUS W/TWL LRG LVL3 (GOWN DISPOSABLE) ×6
KIT TURNOVER KIT A (KITS) ×3 IMPLANT
LABEL OR SOLS (LABEL) ×3 IMPLANT
LIGASURE IMPACT 36 18CM CVD LR (INSTRUMENTS) IMPLANT
NEEDLE HYPO 22GX1.5 SAFETY (NEEDLE) ×3 IMPLANT
NS IRRIG 500ML POUR BTL (IV SOLUTION) ×3 IMPLANT
PACK BASIN MINOR ARMC (MISCELLANEOUS) ×3 IMPLANT
PAD OB MATERNITY 4.3X12.25 (PERSONAL CARE ITEMS) ×3 IMPLANT
PAD PREP 24X41 OB/GYN DISP (PERSONAL CARE ITEMS) ×3 IMPLANT
SOL PREP PVP 2OZ (MISCELLANEOUS) ×3
SOLUTION PREP PVP 2OZ (MISCELLANEOUS) ×1 IMPLANT
SURGILUBE 2OZ TUBE FLIPTOP (MISCELLANEOUS) ×3 IMPLANT
SUT VIC AB 2-0 SH 27 (SUTURE) ×6
SUT VIC AB 2-0 SH 27XBRD (SUTURE) ×2 IMPLANT
SYR 10ML LL (SYRINGE) ×3 IMPLANT
SYR BULB IRRIG 60ML STRL (SYRINGE) ×3 IMPLANT

## 2018-03-19 NOTE — Anesthesia Post-op Follow-up Note (Signed)
Anesthesia QCDR form completed.        

## 2018-03-19 NOTE — ED Notes (Signed)
Pt calling out for pain meds. 

## 2018-03-19 NOTE — Anesthesia Procedure Notes (Signed)
Procedure Name: LMA Insertion Date/Time: 03/19/2018 1:12 PM Performed by: Dava Najjar, CRNA Pre-anesthesia Checklist: Patient identified, Emergency Drugs available, Suction available, Patient being monitored and Timeout performed Patient Re-evaluated:Patient Re-evaluated prior to induction Oxygen Delivery Method: Circle system utilized Preoxygenation: Pre-oxygenation with 100% oxygen Induction Type: IV induction LMA: LMA inserted LMA Size: 4.5 Number of attempts: 1 Placement Confirmation: positive ETCO2 and breath sounds checked- equal and bilateral Tube secured with: Tape Dental Injury: Teeth and Oropharynx as per pre-operative assessment
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
## 2018-03-19 NOTE — H&P (Signed)
Patient ID: Melanie Oneal, female   DOB: Mar 03, 1966, 52 y.o.   MRN: [id number]  HPI Melanie Oneal is a 52 y.o. female asked to see in consultation by the emergency room for possible prolapse. She states that last night she was having some rectal discomfort went to the bathroom and felt something pop out.  Since then she has been having excruciating pain and she feels a lump.  No hematochezia no fevers no chills.  She did have a history of hemorrhoidectomy more than 20 years ago.  Reports that the pain is severe constant and was evaluated by some Dilaudid in the emergency room. Her white count is 14,000 and a hemoglobin is 14.9.  Creatinine is normal.  BUN is elevated She is able to perform more than 4 METS without shortness of breath or chest pain.  She is an active smoker.  She is a Engineer, civil (consulting) for [employer]  HPI  Past Medical History:  Diagnosis Date  . 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 Breast lump 02/26/2015  . Carpal tunnel syndrome    right  . Chest pain 10/28/2012  . Degeneration of intervertebral disc of cervical region 12/28/2015  . Diverticulosis   . Fatigue 02/26/2015  . 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 GERD (gastroesophageal reflux disease)   . Gonalgia 01/25/2016  . 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  Reports abnormal pap smear of vagina approximately one year ago (first pap since hysterectomy) with subsequent colposcopy that was unable to be completed.  Repeat vaginal pap smear performed today.     . Hypothyroidism    HASHIMOTO  . Night sweat 02/26/2015  . Obesity 10/28/2012  . Pre-diabetes   . 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  She declines medication intervention today but is interested in referral to psychiatry for counseling.  Referral  made and contact information provided to patient.    . Sleep apnea    UNDIAGNOSED  . Tobacco use 10/28/2012   Last Assessment & Plan:  Discussed health risks associated with tobacco use.  Assessed readiness to quit.  Patient is not ready to try to quit at this time.  Offered support and guidance when she is ready to try.       Past Surgical History:  Procedure Laterality Date  . ABDOMINAL HYSTERECTOMY     2005  . CARPAL TUNNEL RELEASE Right 12/20/2017   Procedure: CARPAL TUNNEL RELEASE ENDOSCOPIC;  Surgeon: Christena Flake, MD;  Location: [facility];  Service: Orthopedics;  Laterality: Right;  needs to be last patient per tiffany  . CERVICAL DISCECTOMY     FUSION 2 LEVEL  . CHOLECYSTECTOMY    . COLONOSCOPY    . ESOPHAGOGASTRODUODENOSCOPY    . EXPLORATORY LAPAROTOMY     AND LAPAROSCOPY  . HEMORROIDECTOMY    . TONSILLECTOMY      Family History  Problem Relation Age of Onset  . Heart attack Mother   . Diabetes Father   . Heart attack Father   . Hypertension Father   . Diabetes type II Father   . Diabetes gravidarum Sister   . Diabetes Sister   . Diabetes type I Sister   . Diabetes type I Brother   . Kidney disease Brother   . Diabetes Brother   . Diabetes Paternal  Grandmother   . Hyperlipidemia Maternal Aunt   . Lung cancer Maternal Uncle   . Breast cancer Neg Hx     Social History Social History   Tobacco Use  . Smoking status: Current Every Day Smoker    Packs/day: 1.00    Years: 40.00    Pack years: 40.00    Types: Cigarettes  . Smokeless tobacco: Never Used  Substance Use Topics  . Alcohol use: Yes    Alcohol/week: 0.0 oz    Comment: occasional   . Drug use: No    Allergies  Allergen Reactions  . Ceftin Anaphylaxis  . Cefuroxime Anaphylaxis  . Cefuroxime Axetil Anaphylaxis  . Penicillin G Anaphylaxis  . Penicillins Anaphylaxis  . Aleve [Naproxen Sodium] Other (See Comments)    DIFFICULTY CATCHING BREATH  . Bupropion Hives  . Buspar  [Buspirone Hcl] Hives  . Buspirone Hives  . Mucinex [Guaifenesin Er] Other (See Comments)    DIFFICULTY CATCHING BREATH  . Wellbutrin [Bupropion Hcl] Hives    No current facility-administered medications for this encounter.    Current Outpatient Medications  Medication Sig Dispense Refill  . acetaminophen (TYLENOL) 500 MG tablet Take 500 mg by mouth every 6 (six) hours as needed for mild pain.    Marland Kitchen HYDROcodone-acetaminophen (NORCO/VICODIN) 5-325 MG tablet Take 1 tablet by mouth every 6 (six) hours as needed for moderate pain. 10 tablet 0  . levothyroxine (SYNTHROID, LEVOTHROID) 112 MCG tablet Take 112 mcg by mouth daily before breakfast.    . Multiple Vitamin (MULTIVITAMIN) tablet Take 1 tablet by mouth daily.    . pantoprazole (PROTONIX) 40 MG tablet Take 40 mg by mouth daily.    . predniSONE (STERAPRED UNI-PAK 21 TAB) 5 MG (21) TBPK tablet Take 5 mg by mouth daily.       Review of Systems Full ROS  was asked and was negative except for the information on the HPI  Physical Exam Blood pressure 111/62, pulse 84, temperature 98.9 F (37.2 C), temperature source Oral, resp. rate 20, height  (1.676 m), weight 112 kg (247 lb), SpO2 91 %. CONSTITUTIONAL: she is in pain EYES: Pupils are equal, round, and reactive to light, Sclera are non-icteric. EARS, NOSE, MOUTH AND THROAT: The oropharynx is clear. The oral mucosa is pink and moist. Hearing is intact to voice. LYMPH NODES:  Lymph nodes in the neck are normal. RESPIRATORY:  Lungs are clear. There is normal respiratory effort, with equal breath sounds bilaterally, and without pathologic use of accessory muscles. CARDIOVASCULAR: Heart is regular without murmurs, gallops, or rubs. GI: The abdomen is  soft, nontender, and nondistended. There are no palpable masses. There is no hepatosplenomegaly. There are normal bowel sounds in all quadrants. Rectal: There are 2 incarcerated internal hemorrhoids to the right side.  There is exquisitely  tender to palpation.  There is no evidence of perineal sepsis or necrotizing infection.  Exam is  limited but definitively is c/w incarcerated hemorrhoids  MUSCULOSKELETAL: Normal muscle strength and tone. No cyanosis or edema.   SKIN: Turgor is good and there are no pathologic skin lesions or ulcers. NEUROLOGIC: Motor and sensation is grossly normal. Cranial nerves are grossly intact. PSYCH:  Oriented to person, place and time. Affect is normal.  Data Reviewed  I have personally reviewed the patient's imaging, laboratory findings and medical records.    Assessment/Plan 52 year old female with incarcerated internal hemorrhoids.  Will need origin excision.  We will admit to the hospital give her a crystalloid bolus for  dehydration and started on broad-spectrum antibiotics and posterior for exam under anesthesia and hemorrhoidectomy by my partner Dr. Aleen Campi. He is not septic and no need for emergent surgical indication at this time.  Had a lengthy conversation with the patient regarding her disease process and the need for surgery.  Risk benefit and possible complications including but not limited to: Bleeding, infection, recurrence injury to adjacent organs.  She understands and wishes to proceed. Female nurse/chaperone present throughout exam.  Sterling Big, MD FACS General Surgeon 03/19/2018, 5:16 AM

## 2018-03-19 NOTE — ED Triage Notes (Signed)
Pt with noted rectal prolapse that happened one hour pta. Pt states she was getting out of shower when she felt rectum drop, began to bleed and leak some clear fluid from rectum. Pt is in obvious pain, not able to sit.

## 2018-03-19 NOTE — Transfer of Care (Signed)
Immediate Anesthesia Transfer of Care Note  Patient: Melanie Oneal  Procedure(s) Performed: EXAM UNDER ANESTHESIA WITH HEMORRHOIDECTOMY (N/A )  Patient Location: PACU  Anesthesia Type:General  Level of Consciousness: drowsy  Airway & Oxygen Therapy: Patient Spontanous Breathing and Patient connected to face mask oxygen  Post-op Assessment: Report given to RN and Post -op Vital signs reviewed and stable  Post vital signs: Reviewed and stable  Last Vitals:  Vitals Value Taken Time  BP 124/81 03/19/2018  2:57 PM  Temp    Pulse 90 03/19/2018  2:58 PM  Resp 8 03/19/2018  2:58 PM  SpO2 94 % 03/19/2018  2:58 PM  Vitals shown include unvalidated device data.  Last Pain:  Vitals:   03/19/18 1227  TempSrc: Tympanic  PainSc: 6          Complications: No apparent anesthesia complications

## 2018-03-19 NOTE — Op Note (Signed)
  Procedure Date:  03/19/2018  Pre-operative Diagnosis:   Stage 4 internal and external hemorrhoids  Post-operative Diagnosis:  Stage 4 internal and external hemorrhoid, with necrotic right anterior and posterior columns.  Procedure:  Exam under Anesthesia, hemorrhoidectomy of 2 columns  Surgeon:  Howie Ill, MD  Assistant:  Orlean Patten, PA-S  Anesthesia:  General endotracheal  Estimated Blood Loss:  20 ml  Specimens:  Right anterior and posterior hemorrhoidal columns  Complications:  None  Indications for Procedure:  This is a 52 y.o. female with diagnosis of stage 4 internal and external hemorrhoids, requiring resection.  The risks of bleeding, abscess or infection, injury to surrounding structures, and need for further procedures were all discussed with the patient and was willing to proceed.  Description of Procedure: The patient was correctly identified in the preoperative area and brought into the operating room.  The patient was placed supine with VTE prophylaxis in place.  Appropriate time-outs were performed.  Anesthesia was induced and the patient was intubated.  Appropriate antibiotics were infused.  The patient was placed in high lithotomy position as she could not be in candy cane stirrups.  The patient's perineal area was prepped and draped in usual sterile fashion. The anal canal was serially dilated using 1 to 3 fingers. Local anesthetic was infused intradermally making a Pudendal and superficial block.  The right anterior and posterior hemorrhoidal columns were noticeably inflamed and the internal component showed some necrosis.  The two were scored first over the external edge with cautery and LigaSure was used to excise both columns.  Cautery was used for hemostasis.  The anal canal was irrigated and there was no gross bleeding.  A large gelfoam gauze was rolled and infused with lidocaine gel and placed into the anal canal using the bivalve retractor.  The perineal  area was cleaned and the anal opening was dressed with fluff gauze, ABD pad, and mesh underwear.  The patient was placed in flat supine position, extubated, and brought to the recovery room for further management.  The patient tolerated the procedure well and all counts were correct at the end of the case.   Howie Ill, MD

## 2018-03-19 NOTE — ED Notes (Signed)
Pt to subwait with spouse, first nurse dawn aware of complaint and assessment. Pt declines offer for fentanyl at this time.

## 2018-03-19 NOTE — ED Notes (Signed)
Patient educated about not driving or performing other critical tasks (such as operating heavy machinery, caring for infant/toddler/child) due to sedative nature of narcotic medications received while in the ED.  Pt/caregiver verbalized understanding.   

## 2018-03-19 NOTE — Interval H&P Note (Signed)
History and Physical Interval Note:  03/19/2018 10:07 AM  Melanie Oneal  has presented today for surgery, with the diagnosis of n/a  The various methods of treatment have been discussed with the patient and family. After consideration of risks, benefits and other options for treatment, the patient has consented to  Procedure(s): EXAM UNDER ANESTHESIA WITH HEMORRHOIDECTOMY (N/A) as a surgical intervention .  The patient's history has been reviewed, patient examined, no change in status, stable for surgery.  I have reviewed the patient's chart and labs.  Questions were answered to the patient's satisfaction.     Rita Vialpando

## 2018-03-19 NOTE — ED Notes (Signed)
ED Provider at bedside. 

## 2018-03-19 NOTE — Anesthesia Preprocedure Evaluation (Signed)
Anesthesia Evaluation  Patient identified by MRN, date of birth, ID band Patient awake    Reviewed: Allergy & Precautions, H&P , NPO status , Patient's Chart, lab work & pertinent test results, reviewed documented beta blocker date and time   Airway Mallampati: II  TM Distance: >3 FB Neck ROM: full    Dental  (+) Teeth Intact   Pulmonary neg pulmonary ROS, sleep apnea , Current Smoker,    Pulmonary exam normal        Cardiovascular Exercise Tolerance: Good negative cardio ROS Normal cardiovascular exam Rate:Normal     Neuro/Psych  Neuromuscular disease negative neurological ROS  negative psych ROS   GI/Hepatic negative GI ROS, Neg liver ROS, GERD  ,  Endo/Other  negative endocrine ROSHypothyroidism   Renal/GU negative Renal ROS  negative genitourinary   Musculoskeletal   Abdominal   Peds  Hematology negative hematology ROS (+)   Anesthesia Other Findings   Reproductive/Obstetrics negative OB ROS                             Anesthesia Physical Anesthesia Plan  ASA: II  Anesthesia Plan: General LMA   Post-op Pain Management:    Induction:   PONV Risk Score and Plan:   Airway Management Planned:   Additional Equipment:   Intra-op Plan:   Post-operative Plan:   Informed Consent: I have reviewed the patients History and Physical, chart, labs and discussed the procedure including the risks, benefits and alternatives for the proposed anesthesia with the patient or authorized representative who has indicated his/her understanding and acceptance.     Plan Discussed with: CRNA  Anesthesia Plan Comments:         Anesthesia Quick Evaluation

## 2018-03-19 NOTE — ED Triage Notes (Signed)
Patient reports felt like needed to have bowel movement, sat on toilet and intense pain and now with a protrusion from rectum.

## 2018-03-19 NOTE — ED Notes (Signed)
Sugar applied to rectum with Tresa Endo, RN

## 2018-03-19 NOTE — ED Notes (Signed)
Sugar washed from rectum with saline

## 2018-03-20 ENCOUNTER — Encounter: Payer: Self-pay | Admitting: Surgery

## 2018-03-20 LAB — HIV ANTIBODY (ROUTINE TESTING W REFLEX): HIV SCREEN 4TH GENERATION: NONREACTIVE

## 2018-03-20 MED ORDER — CIPROFLOXACIN HCL 500 MG PO TABS
500.0000 mg | ORAL_TABLET | Freq: Two times a day (BID) | ORAL | Status: DC
  Administered 2018-03-20: 500 mg via ORAL
  Filled 2018-03-20: qty 1

## 2018-03-20 MED ORDER — OXYCODONE HCL 5 MG PO TABS: 5.0000 mg | ORAL_TABLET | ORAL | 0 refills | Status: DC | PRN

## 2018-03-20 MED ORDER — CIPROFLOXACIN HCL 500 MG PO TABS: 500.0000 mg | ORAL_TABLET | Freq: Two times a day (BID) | ORAL | 0 refills | Status: DC

## 2018-03-20 MED ORDER — METRONIDAZOLE 500 MG PO TABS: 500.0000 mg | ORAL_TABLET | Freq: Three times a day (TID) | ORAL | 0 refills | Status: DC

## 2018-03-20 MED ORDER — IBUPROFEN 800 MG PO TABS: 800.0000 mg | ORAL_TABLET | Freq: Three times a day (TID) | ORAL | 0 refills | Status: DC | PRN

## 2018-03-20 MED ORDER — METRONIDAZOLE 500 MG PO TABS
500.0000 mg | ORAL_TABLET | Freq: Three times a day (TID) | ORAL | Status: DC
  Administered 2018-03-20: 500 mg via ORAL
  Filled 2018-03-20 (×3): qty 1

## 2018-03-20 MED ORDER — POLYETHYLENE GLYCOL 3350 17 G PO PACK: 17.0000 g | PACK | Freq: Every day | ORAL | 0 refills | Status: AC

## 2018-03-20 MED ORDER — IBUPROFEN 800 MG PO TABS: 800.0000 mg | ORAL_TABLET | Freq: Three times a day (TID) | ORAL | 0 refills | Status: AC | PRN

## 2018-03-20 MED ORDER — POLYETHYLENE GLYCOL 3350 17 G PO PACK: 17.0000 g | PACK | Freq: Every day | ORAL | 0 refills | Status: DC

## 2018-03-20 NOTE — Progress Notes (Addendum)
4 phone calls received 11:30 pm - 12:15 am regarding patient's wish to smoke and threatening to leave AMA if not allowed to go outside to smoke. This was first discussed with patient's RN, who requested patient's IV be removed and ongoing antibiotics be converted to oral antibiotics, stating that patient's IV must be removed prior to patient leaving hospital to smoke. Instructions were communicated to proceed as consistent with [facility] policy, while recognizing the importance of patient's ongoing therapy. Second phone call was to notify that patient intends to leave AMA without antibiotics if she cannot smoke, at which time the importance of ongoing therapy was discussed with patient along with the hope to be able to reach a solution that respect's patient's autonomy and addiction while not jeopardizing her ongoing medical therapy and recovery from recent surgery. Patient's RN again requested oral antibiotics, which was agreed, after which a fourth phone call was received to advise this recommendation is inconsistent with [facility] policy, by which time it seems patient had already left 2c to smoke. Order was communicated to continue/resume IV Cipro and Flagyl if/when patient returns or by mouth if no IV access.  All of patient's and RN supervisor's questions were answered to each's expressed satisfaction.  Please call if any questions or concerns.  -- Scherrie Gerlach Earlene Plater, MD, RPVI [place]:  [facility] for exceptional care. Office: [phone]

## 2018-03-20 NOTE — Progress Notes (Signed)
Melanie Oneal  A and O x 4. VSS. Pt tolerating diet well. No complaints of pain or nausea. IV removed intact, prescriptions given. Pt voiced understanding of discharge instructions with no further questions. Pt refused wheelchair and ambulated down independently.   Orvil Feil MSN, RN-BC  Allergies as of 03/20/2018      Reactions   Ceftin Anaphylaxis   Cefuroxime Anaphylaxis   Cefuroxime Axetil Anaphylaxis   Penicillin G Anaphylaxis   Penicillins Anaphylaxis   Aleve [naproxen Sodium] Other (See Comments)   DIFFICULTY CATCHING BREATH   Bupropion Hives   Buspar [buspirone Hcl] Hives   Buspirone Hives   Mucinex [guaifenesin Er] Other (See Comments)   DIFFICULTY CATCHING BREATH   Wellbutrin [bupropion Hcl] Hives      Medication List    STOP taking these medications   HYDROcodone-acetaminophen 5-325 MG tablet Commonly known as:  NORCO/VICODIN   predniSONE 5 MG (21) Tbpk tablet Commonly known as:  STERAPRED UNI-PAK 21 TAB     TAKE these medications   acetaminophen 500 MG tablet Commonly known as:  TYLENOL Take 500 mg by mouth every 6 (six) hours as needed for mild pain.   ciprofloxacin 500 MG tablet Commonly known as:  CIPRO Take 1 tablet (500 mg total) by mouth 2 (two) times daily.   ibuprofen 800 MG tablet Commonly known as:  ADVIL,MOTRIN Take 1 tablet (800 mg total) by mouth every 8 (eight) hours as needed.   levothyroxine 112 MCG tablet Commonly known as:  SYNTHROID, LEVOTHROID Take 112 mcg by mouth daily before breakfast.   metroNIDAZOLE 500 MG tablet Commonly known as:  FLAGYL Take 1 tablet (500 mg total) by mouth every 8 (eight) hours.   multivitamin tablet Take 1 tablet by mouth daily.   oxyCODONE 5 MG immediate release tablet Commonly known as:  Oxy IR/ROXICODONE Take 1-2 tablets (5-10 mg total) by mouth every 4 (four) hours as needed for moderate pain or severe pain.   pantoprazole 40 MG tablet Commonly known as:  PROTONIX Take 40 mg by mouth daily.    polyethylene glycol packet Commonly known as:  MIRALAX / GLYCOLAX Take 17 g by mouth daily.            Discharge Care Instructions  (From admission, onward)        Start     Ordered   03/20/18 0000  Change dressing (specify)    Comments:  Fluff gauze and ABD pad or dry pad and change as needed.   03/20/18 0847      Vitals:   03/19/18 2042 03/20/18 0550  BP: 111/67 109/65  Pulse: 88 71  Resp: 16 20  Temp: 98 F (36.7 C) 97.6 F (36.4 C)  SpO2: (!) 85% 93%

## 2018-03-20 NOTE — Discharge Summary (Signed)
Patient ID: Melanie Oneal MRN: [id number] DOB/AGE: [birth year] 54 y.o.  Admit date: 03/19/2018 Discharge date: 03/20/2018   Discharge Diagnoses:  Active Problems:   Hemorrhoid prolapse   Procedures:  Exam under Anesthesia, Hemorrhoidectomy of 2 columns  Hospital Course:  Patient was admitted on 03/19/18 with stage IV incarcerated hemorrhoids of acute onset.  She was taken to the OR on 5/6 and underwent exam under anesthesia and hemorrhoidectomy of right anterior and posterior columns.  Her pain was well controlled post-op and was discharged to home in good condition.  On exam, she was in no acute distress with stable vital signs.  Her perianal area was healing appropriately with no significant bleeding from the wounds.    Consults: None  Disposition: Discharge disposition: 01-Home or Self Care       Discharge Instructions    Call MD for:  difficulty breathing, headache or visual disturbances   Complete by:  As directed    Call MD for:  persistant nausea and vomiting   Complete by:  As directed    Call MD for:  redness, tenderness, or signs of infection (pain, swelling, redness, odor or green/yellow discharge around incision site)   Complete by:  As directed    Call MD for:  severe uncontrolled pain   Complete by:  As directed    Call MD for:  temperature >100.4   Complete by:  As directed    Change dressing (specify)   Complete by:  As directed    Fluff gauze and ABD pad or dry pad and change as needed.   Diet - low sodium heart healthy   Complete by:  As directed    Discharge instructions   Complete by:  As directed    1.  Patient may shower, but do not scrub wound heavily and dab dry only. 2.  Do Sitz baths twice daily and after bowel movements   Driving Restrictions   Complete by:  As directed    Do not drive while taking narcotics for pain control.   Increase activity slowly   Complete by:  As directed      Allergies as of 03/20/2018      Reactions   Ceftin  Anaphylaxis   Cefuroxime Anaphylaxis   Cefuroxime Axetil Anaphylaxis   Penicillin G Anaphylaxis   Penicillins Anaphylaxis   Aleve [naproxen Sodium] Other (See Comments)   DIFFICULTY CATCHING BREATH   Bupropion Hives   Buspar [buspirone Hcl] Hives   Buspirone Hives   Mucinex [guaifenesin Er] Other (See Comments)   DIFFICULTY CATCHING BREATH   Wellbutrin [bupropion Hcl] Hives      Medication List    STOP taking these medications   HYDROcodone-acetaminophen 5-325 MG tablet Commonly known as:  NORCO/VICODIN   predniSONE 5 MG (21) Tbpk tablet Commonly known as:  STERAPRED UNI-PAK 21 TAB     TAKE these medications   acetaminophen 500 MG tablet Commonly known as:  TYLENOL Take 500 mg by mouth every 6 (six) hours as needed for mild pain.   ciprofloxacin 500 MG tablet Commonly known as:  CIPRO Take 1 tablet (500 mg total) by mouth 2 (two) times daily.   ibuprofen 800 MG tablet Commonly known as:  ADVIL,MOTRIN Take 1 tablet (800 mg total) by mouth every 8 (eight) hours as needed.   levothyroxine 112 MCG tablet Commonly known as:  SYNTHROID, LEVOTHROID Take 112 mcg by mouth daily before breakfast.   metroNIDAZOLE 500 MG tablet Commonly known as:  FLAGYL Take 1  tablet (500 mg total) by mouth every 8 (eight) hours.   multivitamin tablet Take 1 tablet by mouth daily.   oxyCODONE 5 MG immediate release tablet Commonly known as:  Oxy IR/ROXICODONE Take 1-2 tablets (5-10 mg total) by mouth every 4 (four) hours as needed for moderate pain or severe pain.   pantoprazole 40 MG tablet Commonly known as:  PROTONIX Take 40 mg by mouth daily.   polyethylene glycol packet Commonly known as:  MIRALAX / GLYCOLAX Take 17 g by mouth daily.            Discharge Care Instructions  (From admission, onward)        Start     Ordered   03/20/18 0000  Change dressing (specify)    Comments:  Fluff gauze and ABD pad or dry pad and change as needed.   03/20/18 0847     Follow-up  Information    Henrene Dodge, MD Follow up in 2 week(s).   Specialty:  Surgery Contact information: [address] [place] [address] [phone]

## 2018-03-21 LAB — SURGICAL PATHOLOGY

## 2018-03-21 NOTE — Anesthesia Postprocedure Evaluation (Signed)
Anesthesia Post Note  Patient: Melanie Oneal  Procedure(s) Performed: EXAM UNDER ANESTHESIA WITH HEMORRHOIDECTOMY (N/A )  Patient location during evaluation: PACU Anesthesia Type: General Level of consciousness: awake and alert Pain management: pain level controlled Vital Signs Assessment: post-procedure vital signs reviewed and stable Respiratory status: spontaneous breathing, nonlabored ventilation and respiratory function stable Cardiovascular status: blood pressure returned to baseline and stable Postop Assessment: no apparent nausea or vomiting Anesthetic complications: no     Last Vitals:  Vitals:   03/19/18 2042 03/20/18 0550  BP: 111/67 109/65  Pulse: 88 71  Resp: 16 20  Temp: 36.7 C 36.4 C  SpO2: (!) 85% 93%    Last Pain:  Vitals:   03/20/18 1036  TempSrc:   PainSc: 0-No pain                 Christia Reading

## 2018-03-30 ENCOUNTER — Encounter: Payer: Self-pay | Admitting: Surgery

## 2018-03-30 ENCOUNTER — Ambulatory Visit (INDEPENDENT_AMBULATORY_CARE_PROVIDER_SITE_OTHER): Payer: 59 | Admitting: Surgery

## 2018-03-30 ENCOUNTER — Telehealth: Payer: Self-pay

## 2018-03-30 VITALS — BP 113/80 | HR 106 | Temp 97.8°F | Ht 67.0 in | Wt 243.8 lb

## 2018-03-30 DIAGNOSIS — Z09 Encounter for follow-up examination after completed treatment for conditions other than malignant neoplasm: Secondary | ICD-10-CM

## 2018-03-30 MED ORDER — OXYCODONE HCL 5 MG PO TABS: 5.0000 mg | ORAL_TABLET | ORAL | 0 refills | Status: DC | PRN

## 2018-03-30 NOTE — Patient Instructions (Signed)
Please continue to shower after each bowel movement. Please continue to take sitz baths twice daily.  Please try Imodium to see if this helps.

## 2018-03-30 NOTE — Telephone Encounter (Signed)
Disability and FMLA forms have been filled out and left at the front desk for patient to pick up per patient's request.

## 2018-03-30 NOTE — Progress Notes (Signed)
S/p hemorrhoidectomy x 2 on the right for incarcerated internal hemorrhoids Still having some pain, no fever Loose bm  PE NAD Abd: soft, nt Hemorrhoidectomy site healing well no evidence of necrotizing infection or perineal sepsis. There is a new hemorrhoid on the left on the lateal side  A/p Doing well hemorrhoidectomy new left hemorrhoid We will treat medically sitz baths and pain control RTC 2 weeks Work excuse until then

## 2018-04-04 ENCOUNTER — Telehealth: Payer: Self-pay | Admitting: Surgery

## 2018-04-04 NOTE — Telephone Encounter (Signed)
Patient is calling said she has been doing bath sits right now, but is now asking can she get in and out of a pool. Please call patient and advise.

## 2018-04-04 NOTE — Telephone Encounter (Signed)
Spoke with patient. We discussed due to recent surgery she should hold off on the pool until she sees Dr.Pabon next week.  She stated the hemorrhoid is better. She was instructed to continue the sitz baths.

## 2018-04-16 ENCOUNTER — Encounter: Payer: Self-pay | Admitting: Surgery

## 2018-04-16 ENCOUNTER — Ambulatory Visit (INDEPENDENT_AMBULATORY_CARE_PROVIDER_SITE_OTHER): Payer: 59 | Admitting: Surgery

## 2018-04-16 DIAGNOSIS — R197 Diarrhea, unspecified: Secondary | ICD-10-CM

## 2018-04-16 NOTE — Patient Instructions (Signed)
Please continue to do the sitz baths at least twice a day.  Try to eat more fiber in your diet.

## 2018-04-16 NOTE — Progress Notes (Signed)
S/p hemorrhoidectomy x 2 on the right for incarcerated internal hemorrhoids by Dr. Aleen CampiPiscoya on 5/6 She know has multiple loose BM about 30 a day. Pain on the right side   PE NAD Abd: soft, nt Hemorrhoidectomy site healing well no evidence of necrotizing infection or perineal sepsis.  The hemorrhoid on the left is improved as compared to previous exam  A/p  Given diarrhea will check C diff No evidence of perineal infection May take imodium after C diff is r/o RT 2 weeks

## 2018-04-17 ENCOUNTER — Other Ambulatory Visit: Payer: Self-pay

## 2018-04-17 ENCOUNTER — Other Ambulatory Visit
Admission: RE | Admit: 2018-04-17 | Discharge: 2018-04-17 | Disposition: A | Discharge status: Home / Self Care | Payer: 59 | Source: Ambulatory Visit | Attending: Surgery | Admitting: Surgery

## 2018-04-17 DIAGNOSIS — R197 Diarrhea, unspecified: Secondary | ICD-10-CM

## 2018-04-17 LAB — C DIFFICILE QUICK SCREEN W PCR REFLEX
C DIFFICILE (CDIFF) INTERP: NOT DETECTED
C Diff antigen: NEGATIVE
C Diff toxin: NEGATIVE

## 2018-04-19 ENCOUNTER — Telehealth: Payer: Self-pay

## 2018-04-19 NOTE — Telephone Encounter (Signed)
Patient called wanting her lab results. I told her that her C-Diff came out to be negative and that she should start using the Imodium as recommended by Dr. Everlene FarrierPabon on her last visit. Patient understood and had no further questions.

## 2018-05-11 ENCOUNTER — Encounter: Payer: Self-pay | Admitting: Surgery

## 2018-05-21 ENCOUNTER — Telehealth: Payer: Self-pay | Admitting: *Deleted

## 2018-05-21 NOTE — Telephone Encounter (Signed)
Left message for patient to call back schedule her appt. Missed on 05/11/2018

## 2018-05-24 NOTE — Telephone Encounter (Signed)
Left a message for the patient to call the office. °

## 2018-06-08 ENCOUNTER — Encounter: Payer: Self-pay | Admitting: Surgery

## 2018-06-08 NOTE — Telephone Encounter (Signed)
Called and left another message for the patient to contact our office, I have also mailed a letter for the patient to contact our office.

## 2018-09-12 ENCOUNTER — Encounter: Payer: Self-pay | Admitting: Anesthesiology

## 2018-09-12 ENCOUNTER — Encounter: Payer: Self-pay | Admitting: *Deleted

## 2018-09-12 ENCOUNTER — Other Ambulatory Visit: Payer: Self-pay

## 2018-09-19 ENCOUNTER — Ambulatory Visit: Admission: RE | Admit: 2018-09-19 | Payer: 59 | Source: Ambulatory Visit | Admitting: Surgery

## 2018-09-19 SURGERY — RELEASE, CARPAL TUNNEL, ENDOSCOPIC
Anesthesia: Choice | Laterality: Left

## 2019-09-24 ENCOUNTER — Other Ambulatory Visit: Payer: Self-pay

## 2019-09-24 DIAGNOSIS — Z20822 Contact with and (suspected) exposure to covid-19: Secondary | ICD-10-CM

## 2019-09-26 LAB — NOVEL CORONAVIRUS, NAA: SARS-CoV-2, NAA: NOT DETECTED

## 2021-01-21 ENCOUNTER — Emergency Department
Admission: EM | Admit: 2021-01-21 | Discharge: 2021-01-21 | Disposition: A | Discharge status: Home / Self Care | Payer: BC Managed Care – PPO | Attending: Emergency Medicine | Admitting: Emergency Medicine

## 2021-01-21 ENCOUNTER — Emergency Department: Payer: BC Managed Care – PPO

## 2021-01-21 ENCOUNTER — Other Ambulatory Visit: Payer: Self-pay

## 2021-01-21 DIAGNOSIS — Z5321 Procedure and treatment not carried out due to patient leaving prior to being seen by health care provider: Secondary | ICD-10-CM | POA: Insufficient documentation

## 2021-01-21 DIAGNOSIS — R079 Chest pain, unspecified: Secondary | ICD-10-CM | POA: Insufficient documentation

## 2021-01-21 DIAGNOSIS — R202 Paresthesia of skin: Secondary | ICD-10-CM | POA: Insufficient documentation

## 2021-01-21 DIAGNOSIS — R110 Nausea: Secondary | ICD-10-CM | POA: Insufficient documentation

## 2021-01-21 DIAGNOSIS — R42 Dizziness and giddiness: Secondary | ICD-10-CM | POA: Insufficient documentation

## 2021-01-21 LAB — CBC
HCT: 44.7 % (ref 36.0–46.0)
Hemoglobin: 14.5 g/dL (ref 12.0–15.0)
MCH: 29.2 pg (ref 26.0–34.0)
MCHC: 32.4 g/dL (ref 30.0–36.0)
MCV: 89.9 fL (ref 80.0–100.0)
Platelets: 349 10*3/uL (ref 150–400)
RBC: 4.97 MIL/uL (ref 3.87–5.11)
RDW: 14.3 % (ref 11.5–15.5)
WBC: 12.6 10*3/uL — ABNORMAL HIGH (ref 4.0–10.5)
nRBC: 0 % (ref 0.0–0.2)

## 2021-01-21 LAB — BASIC METABOLIC PANEL
Anion gap: 9 (ref 5–15)
BUN: 13 mg/dL (ref 6–20)
CO2: 27 mmol/L (ref 22–32)
Calcium: 9.1 mg/dL (ref 8.9–10.3)
Chloride: 99 mmol/L (ref 98–111)
Creatinine, Ser: 1.08 mg/dL — ABNORMAL HIGH (ref 0.44–1.00)
GFR, Estimated: 60 mL/min — ABNORMAL LOW (ref >60)
Glucose, Bld: 127 mg/dL — ABNORMAL HIGH (ref 70–99)
Potassium: 4 mmol/L (ref 3.5–5.1)
Sodium: 135 mmol/L (ref 135–145)

## 2021-01-21 LAB — TROPONIN I (HIGH SENSITIVITY)
Troponin I (High Sensitivity): 5 ng/L (ref <18)
Troponin I (High Sensitivity): 4 ng/L (ref <18)

## 2021-01-21 NOTE — ED Triage Notes (Signed)
Intermittent L sided chest pain today. Reports tonight pain began radiating to L arm and jaw with associated L hand numbness. Reports nausea onset at this time as well. Reports feeling dizzy and lightheaded with position changes throughout day today. Denies h/a or emesis. Pt alert and oriented in triage with unlabored respirations.

## 2021-12-28 IMAGING — CR DG CHEST 2V
2 series · 2 of 2 positions shown · non-contrast
Comparison: Most recent radiograph 08/22/2014

CLINICAL DATA: Chest pain.  Intermittent left-sided pain today.

EXAM:
CHEST - 2 VIEW

[chest pa]
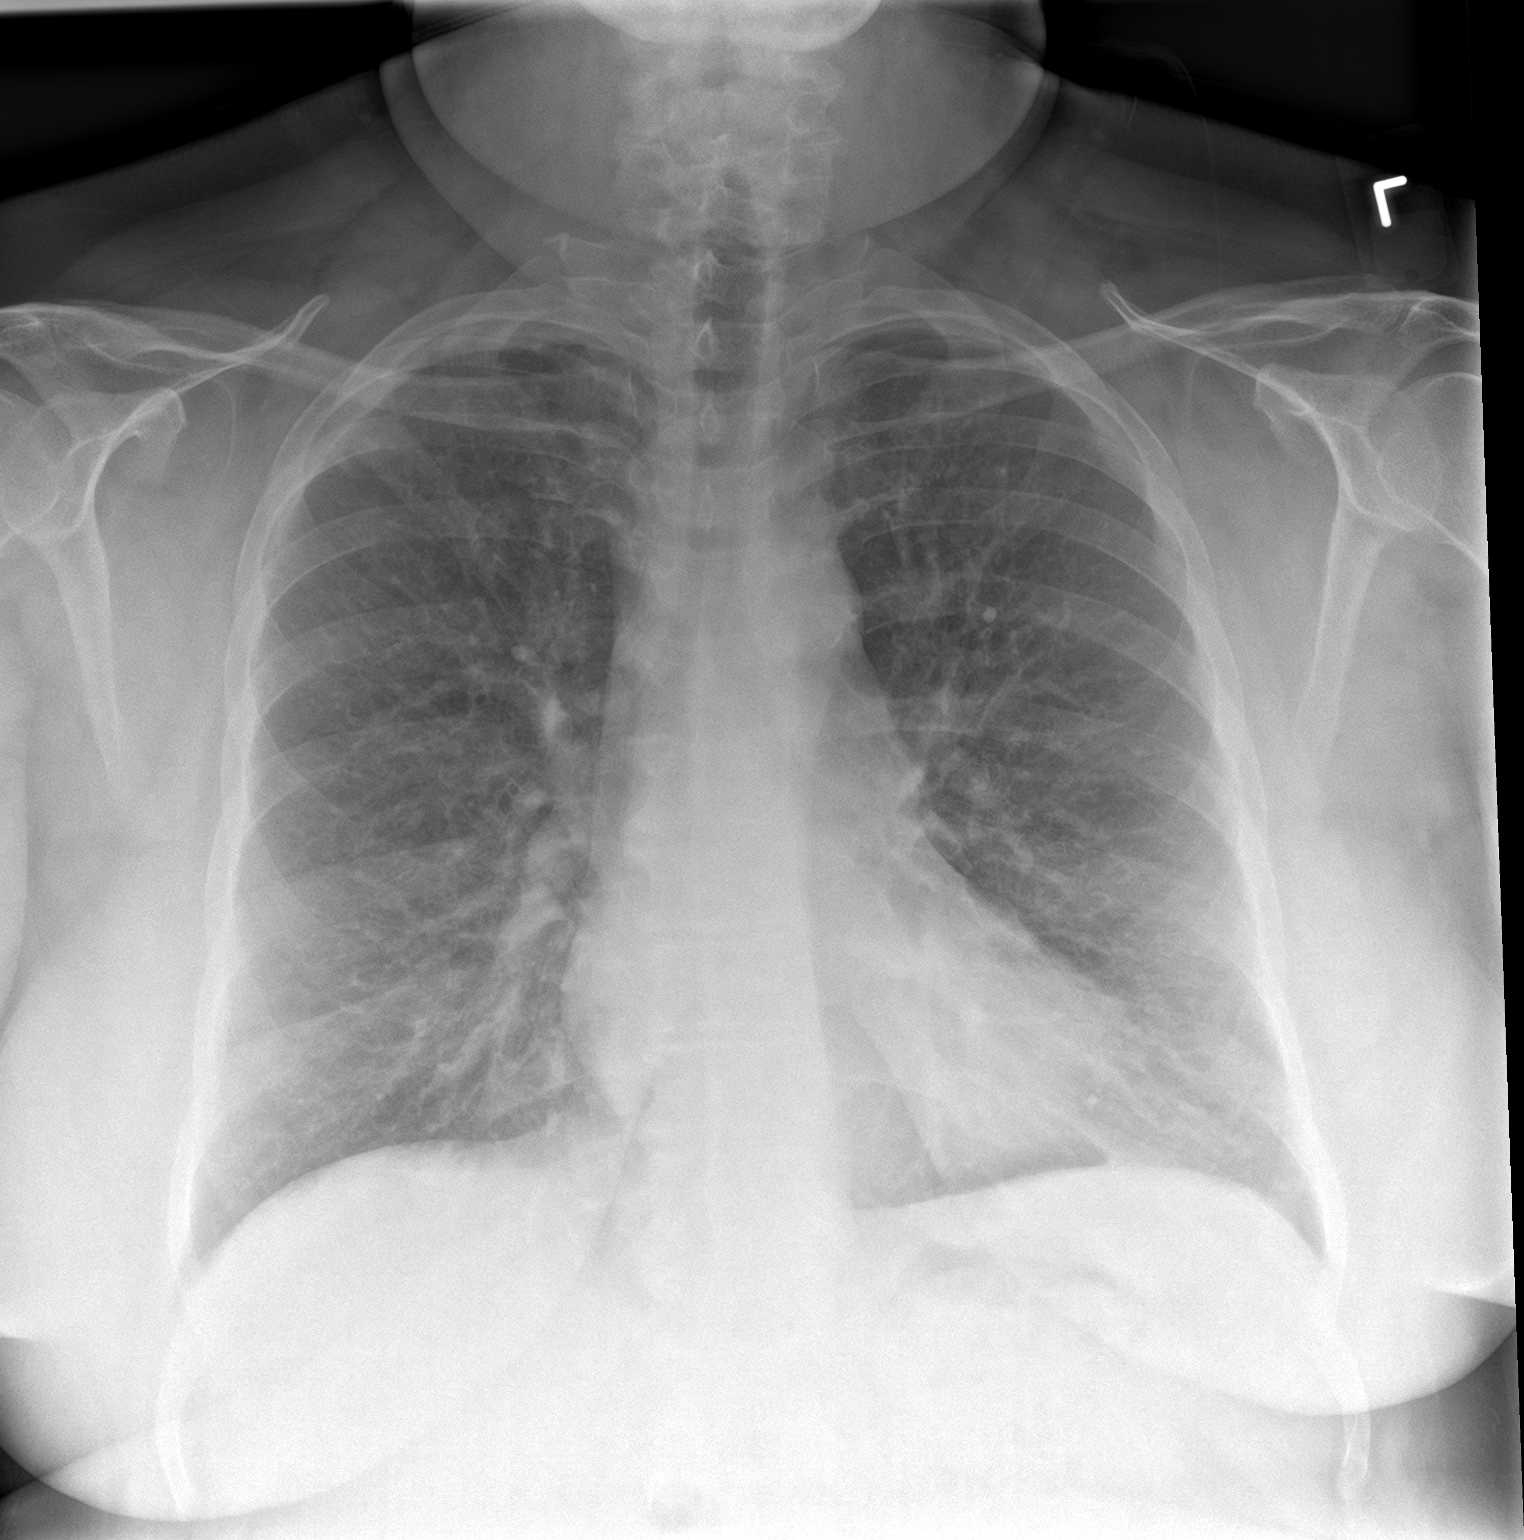

[chest lat]
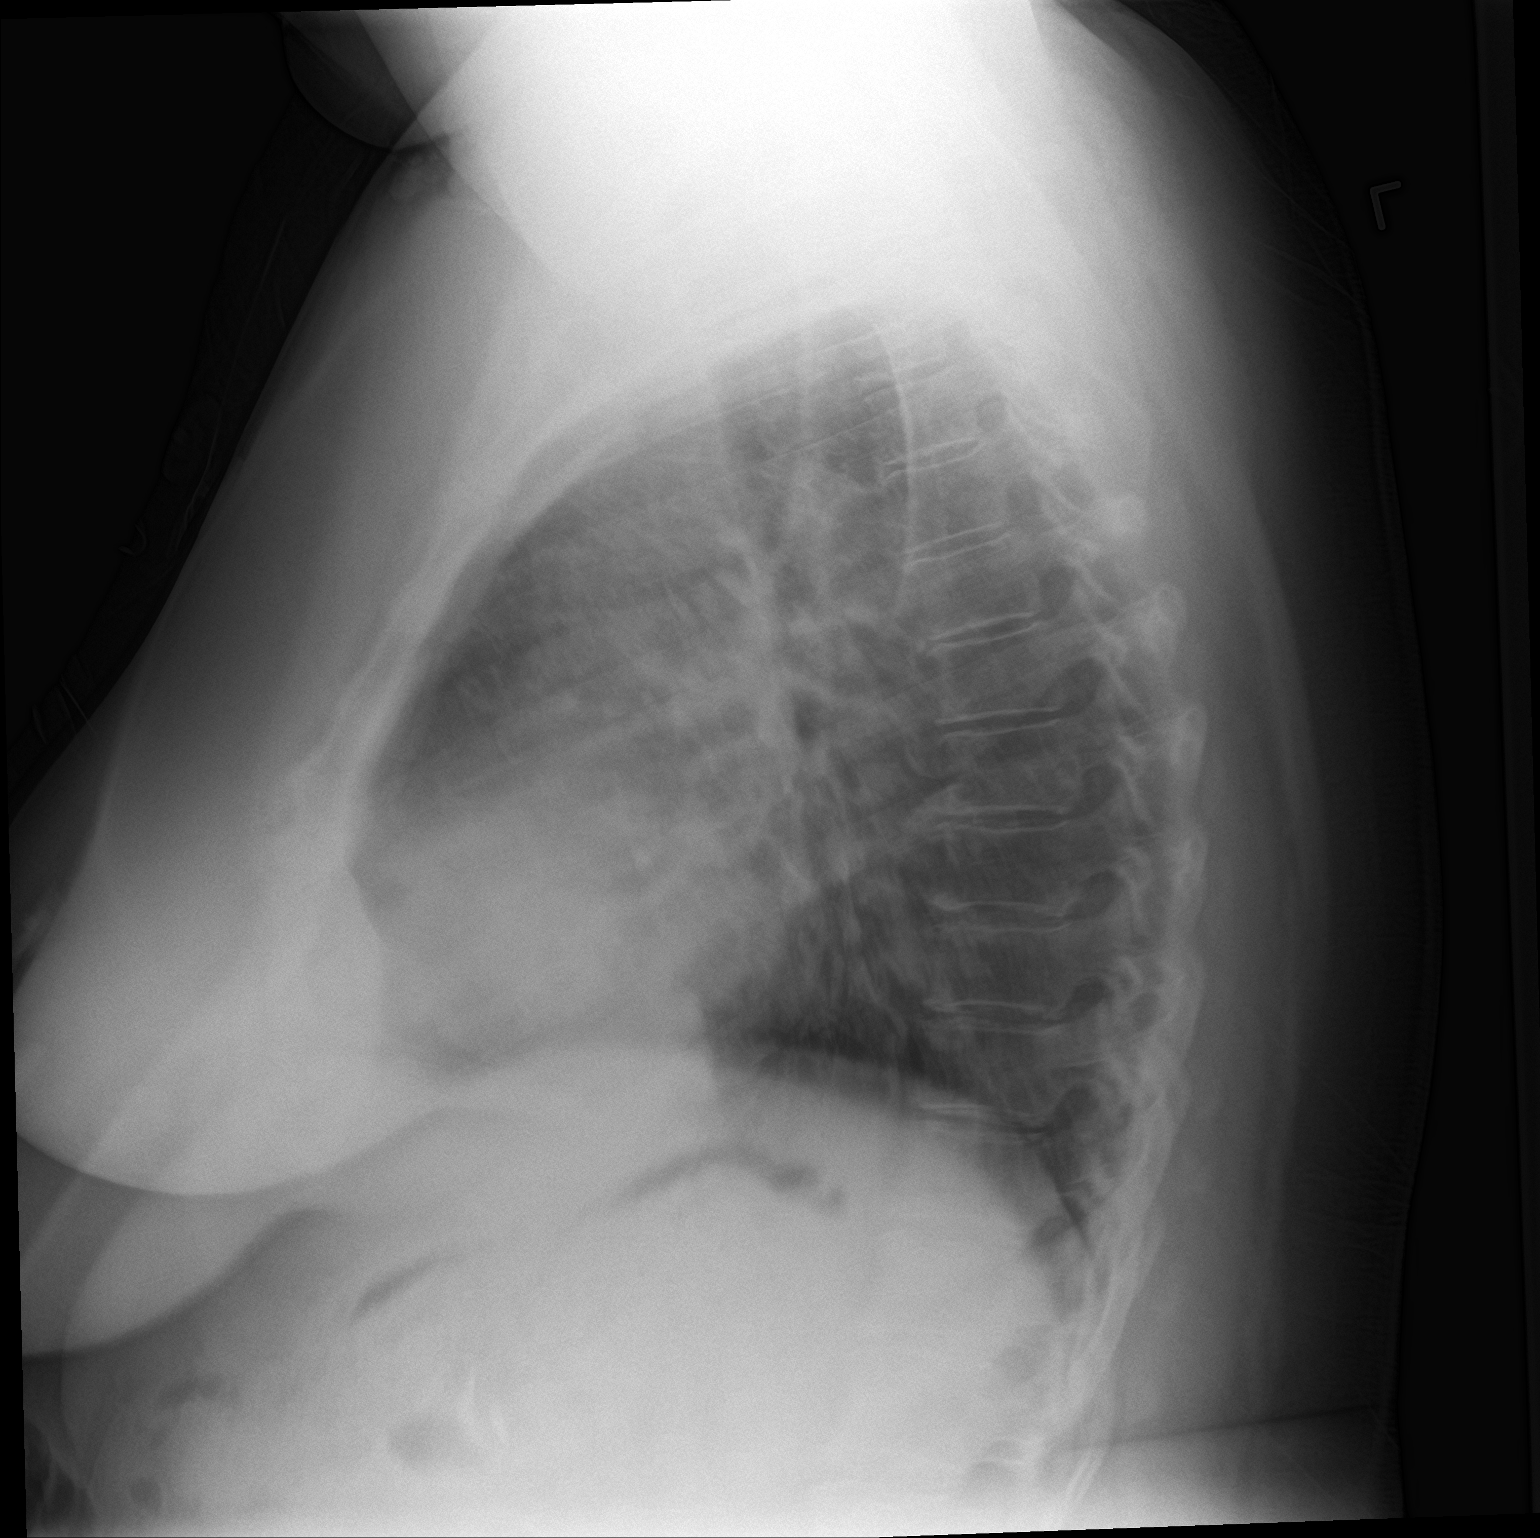

[2 of 2 positions shown; findings below may reference images not displayed]

FINDINGS: Mild motion on the lateral view. Hyperinflation. Chronic bronchial
thickening. No acute airspace disease. Normal heart size and
mediastinal contours. No pleural fluid or pneumothorax. EKG leads
overlie the chest. No acute osseous abnormalities are seen.
IMPRESSION: Chronic hyperinflation and bronchial thickening. No acute
abnormality.

## 2024-01-19 ENCOUNTER — Ambulatory Visit: Payer: Self-pay | Admitting: *Deleted
